# Patient Record
Sex: Female | Born: 1985
Health system: Southern US, Community
[De-identification: ages and names within clinical notes are randomized; demographics above are authoritative.]

## PROBLEM LIST (undated history)

## (undated) DIAGNOSIS — I959 Hypotension, unspecified: Secondary | ICD-10-CM

## (undated) DIAGNOSIS — M329 Systemic lupus erythematosus, unspecified: Secondary | ICD-10-CM

## (undated) DIAGNOSIS — IMO0002 Reserved for concepts with insufficient information to code with codable children: Secondary | ICD-10-CM

## (undated) DIAGNOSIS — I10 Essential (primary) hypertension: Secondary | ICD-10-CM

## (undated) HISTORY — DX: Essential (primary) hypertension: I10

## (undated) HISTORY — PX: NO PAST SURGERIES: SHX2092

---

## 2012-06-20 ENCOUNTER — Encounter (HOSPITAL_COMMUNITY): Payer: Self-pay | Admitting: Emergency Medicine

## 2012-06-20 ENCOUNTER — Emergency Department (HOSPITAL_COMMUNITY)
Admission: EM | Admit: 2012-06-20 | Discharge: 2012-06-20 | Disposition: A | Discharge status: Home / Self Care | Payer: Self-pay | Attending: Emergency Medicine | Admitting: Emergency Medicine

## 2012-06-20 DIAGNOSIS — R51 Headache: Secondary | ICD-10-CM | POA: Insufficient documentation

## 2012-06-20 DIAGNOSIS — Z8679 Personal history of other diseases of the circulatory system: Secondary | ICD-10-CM | POA: Insufficient documentation

## 2012-06-20 DIAGNOSIS — I959 Hypotension, unspecified: Secondary | ICD-10-CM | POA: Insufficient documentation

## 2012-06-20 HISTORY — DX: Hypotension, unspecified: I95.9

## 2012-06-20 MED ORDER — METOCLOPRAMIDE HCL 5 MG/ML IJ SOLN
10.0000 mg | Freq: Once | INTRAMUSCULAR | Status: AC
  Administered 2012-06-20: 10 mg via INTRAVENOUS
  Filled 2012-06-20: qty 2

## 2012-06-20 MED ORDER — SODIUM CHLORIDE 0.9 % IV BOLUS (SEPSIS)
1000.0000 mL | Freq: Once | INTRAVENOUS | Status: AC
  Administered 2012-06-20: 1000 mL via INTRAVENOUS

## 2012-06-20 MED ORDER — IBUPROFEN 800 MG PO TABS: 800.0000 mg | ORAL_TABLET | Freq: Three times a day (TID) | ORAL | Status: DC

## 2012-06-20 MED ORDER — DEXAMETHASONE SODIUM PHOSPHATE 4 MG/ML IJ SOLN
10.0000 mg | Freq: Once | INTRAMUSCULAR | Status: AC
  Administered 2012-06-20: 10 mg via INTRAVENOUS
  Filled 2012-06-20: qty 3

## 2012-06-20 MED ORDER — DIPHENHYDRAMINE HCL 50 MG/ML IJ SOLN
25.0000 mg | Freq: Once | INTRAMUSCULAR | Status: AC
  Administered 2012-06-20: 25 mg via INTRAVENOUS
  Filled 2012-06-20: qty 1

## 2012-06-20 NOTE — ED Notes (Signed)
The patient is AOx4 and comfortable with her discharge instructions. 

## 2012-06-20 NOTE — ED Notes (Signed)
MD at bedside. 

## 2012-06-20 NOTE — ED Provider Notes (Signed)
History     CSN: [id number]  Arrival date & time 06/20/12  0010   First MD Initiated Contact with Patient 06/20/12 [phone]      Chief Complaint  Patient presents with  . Headache    (Consider location/radiation/quality/duration/timing/severity/associated sxs/prior treatment) HPI  HX per PT thru Materials engineer.  HA onset yesterday, gradual and now severe. Location all over, mod to severe, no N/V, no neck pain or stiffness, no recorded fevers, no chills or rash, no sore throat or sick contacts. H/o similar HAs in the past.  She has been admitted in the past for low blood pressure and that is a PT concern tonight - BP has not been taken at home and here in the ER is WNL.   Past Medical History  Diagnosis Date  . Hypotension     History reviewed. No pertinent past surgical history.  History reviewed. No pertinent family history.  History  Substance Use Topics  . Smoking status: Never Smoker   . Smokeless tobacco: Not on file  . Alcohol Use: No    OB History   Grav Para Term Preterm Abortions TAB SAB Ect Mult Living                  Review of Systems  Constitutional: Negative for fever and chills.  HENT: Negative for neck pain and neck stiffness.   Eyes: Negative for pain.  Respiratory: Negative for shortness of breath.   Cardiovascular: Negative for chest pain.  Gastrointestinal: Negative for abdominal pain.  Genitourinary: Negative for dysuria.  Musculoskeletal: Negative for back pain.  Skin: Negative for rash.  Neurological: Positive for headaches. Negative for dizziness, tremors, syncope, speech difficulty, weakness and numbness.  All other systems reviewed and are negative.    Allergies  Review of patient's allergies indicates no known allergies.  Home Medications  No current outpatient prescriptions on file.  BP 100/63  Pulse 80  Temp(Src) 97.5 F (36.4 C) (Oral)  Resp 19  Ht 4\' 11"  (1.499 m)  Wt 79 lb 5.9 oz (36 kg)  BMI 16.02 kg/m2  SpO2 100%   LMP 06/17/2012  Physical Exam  Constitutional: She is oriented to person, place, and time. She appears well-developed and well-nourished.  HENT:  Head: Normocephalic and atraumatic.  Eyes: Conjunctivae and EOM are normal. Pupils are equal, round, and reactive to light.  Neck: Full passive range of motion without pain. Neck supple. No thyromegaly present.  No meningismus  Cardiovascular: Normal rate, regular rhythm, S1 normal, S2 normal and intact distal pulses.   Pulmonary/Chest: Effort normal and breath sounds normal.  Abdominal: Soft. Bowel sounds are normal. There is no tenderness. There is no CVA tenderness.  Musculoskeletal: Normal range of motion.  Neurological: She is alert and oriented to person, place, and time. She has normal strength and normal reflexes. No cranial nerve deficit or sensory deficit. She displays a negative Romberg sign. GCS eye subscore is 4. GCS verbal subscore is 5. GCS motor subscore is 6.  Normal Gait  Skin: Skin is warm and dry. No rash noted. No cyanosis. Nails show no clubbing.  Psychiatric: She has a normal mood and affect. Her speech is normal and behavior is normal.    ED Course  Procedures (including critical care time)  IVFs and IV HA cocktail  4:27 AM HA is much better, normal neuro exam  PLAN d/c home, Rx Motrin and referral to NEU.  No indication for imaging or admit at this time.   MDM  Headache with no neuro deficits. No meningismus.   Improved with IV fluids and medications provided.  Vital signs and nursing notes reviewed and considered        Sunnie Nielsen, MD 06/20/12 [phone]

## 2012-06-20 NOTE — ED Notes (Addendum)
Patient complaining of headache and body tingling that started around 0900 yesterday morning.  Patient complaining of dizziness; denies nausea and vomiting.  Interpreter phones used to translate -- patient speaks [place].

## 2012-06-26 ENCOUNTER — Emergency Department (HOSPITAL_COMMUNITY)
Admission: EM | Admit: 2012-06-26 | Discharge: 2012-06-27 | Disposition: A | Discharge status: Home / Self Care | Payer: Self-pay | Attending: Emergency Medicine | Admitting: Emergency Medicine

## 2012-06-26 ENCOUNTER — Encounter (HOSPITAL_COMMUNITY): Payer: Self-pay | Admitting: Emergency Medicine

## 2012-06-26 DIAGNOSIS — R0609 Other forms of dyspnea: Secondary | ICD-10-CM | POA: Insufficient documentation

## 2012-06-26 DIAGNOSIS — R0989 Other specified symptoms and signs involving the circulatory and respiratory systems: Secondary | ICD-10-CM | POA: Insufficient documentation

## 2012-06-26 DIAGNOSIS — J029 Acute pharyngitis, unspecified: Secondary | ICD-10-CM | POA: Insufficient documentation

## 2012-06-26 NOTE — ED Notes (Signed)
PT. REPORTS SORE THROAT WITH CHILLS ONSET THIS EVENING , DENIES COUGH OR FEVER .

## 2012-06-27 LAB — RAPID STREP SCREEN (MED CTR MEBANE ONLY): Streptococcus, Group A Screen (Direct): NEGATIVE

## 2012-06-27 MED ORDER — HYDROCODONE-ACETAMINOPHEN 5-325 MG PO TABS: 1.0000 | ORAL_TABLET | ORAL | Status: DC | PRN

## 2012-06-27 MED ORDER — LIDOCAINE VISCOUS 2 % MT SOLN
20.0000 mL | Freq: Once | OROMUCOSAL | Status: AC
  Administered 2012-06-27: 20 mL via OROMUCOSAL

## 2012-06-27 NOTE — ED Provider Notes (Signed)
Medical screening examination/treatment/procedure(s) were performed by non-physician practitioner and as supervising physician I was immediately available for consultation/collaboration.  Sunnie Nielsen, MD 06/27/12 [phone]

## 2012-06-27 NOTE — ED Notes (Signed)
Pt denies any pain or questions upon discharge. 

## 2012-06-27 NOTE — ED Provider Notes (Signed)
History     CSN: [id number]  Arrival date & time 06/26/12  2311   First MD Initiated Contact with Patient 06/27/12 0034      Chief Complaint  Patient presents with  . Sore Throat    (Consider location/radiation/quality/duration/timing/severity/associated sxs/prior treatment) HPI History provided by pt and interpreter.  Pt c/o sore throat since 9pm last night.  Associated w/ dyspnea.  Denies fever, nasal congestion, cough.  Denies lip/tongue edema and rash.  Has not taken anything for symptoms.  No known sick contacts.  No known allergies or new contacts.  Past Medical History  Diagnosis Date  . Hypotension     History reviewed. No pertinent past surgical history.  No family history on file.  History  Substance Use Topics  . Smoking status: Never Smoker   . Smokeless tobacco: Not on file  . Alcohol Use: No    OB History   Grav Para Term Preterm Abortions TAB SAB Ect Mult Living                  Review of Systems  All other systems reviewed and are negative.    Allergies  Review of patient's allergies indicates no known allergies.  Home Medications  No current outpatient prescriptions on file.  BP 137/91  Pulse 80  Temp(Src) 97.6 F (36.4 C) (Oral)  Resp 16  SpO2 100%  LMP 06/17/2012  Physical Exam  Nursing note and vitals reviewed. Constitutional: She is oriented to person, place, and time. She appears well-developed and well-nourished. No distress.  HENT:  Head: Normocephalic and atraumatic.  Posterior pharynx nml but soft palate slightly erythematous.  No tonsillar edema or exudate.  Uvula mid-line.  No trismus.  Epiglottis visible and appears nml  Eyes:  Normal appearance  Neck: Normal range of motion.  Cardiovascular: Normal rate and regular rhythm.   Pulmonary/Chest: Effort normal and breath sounds normal. No respiratory distress.  Musculoskeletal: Normal range of motion.  Lymphadenopathy:    She has cervical adenopathy.  Neurological: She is  alert and oriented to person, place, and time.  Skin: Skin is warm and dry. No rash noted.  Psychiatric: She has a normal mood and affect. Her behavior is normal.    ED Course  Procedures (including critical care time)  Labs Reviewed - No data to display No results found.   1. Viral pharyngitis       MDM  27yo F presents w/ sore throat x 4 hours.  Reports that the pain is making it difficult for her to breath.  On exam, afebrile, no respiratory distress, no stridor, mild erythema soft palate, no lip/tongue edema, no hives.  Rapid strep obtained d/t communication barrier.  Pt to receive viscous lidocaine.  Will reassess shortly.  Doubt anaphylaxis; I believe patient is misinterpreting her pain.  1:31 AM    Pt reports that her pain and difficulty breathing are resolved.  Strep screen neg.  D/c'd home w/ short course of vicodin and recommended ibuprofen as well.  Return precautions discussed.        Otilio Miu, PA-C 06/27/12 2000

## 2012-06-27 NOTE — ED Notes (Signed)
Pt denies any pain at this time.

## 2013-03-10 ENCOUNTER — Other Ambulatory Visit (HOSPITAL_COMMUNITY): Payer: Self-pay | Admitting: Nurse Practitioner

## 2013-03-10 DIAGNOSIS — Z3689 Encounter for other specified antenatal screening: Secondary | ICD-10-CM

## 2013-03-13 ENCOUNTER — Ambulatory Visit (HOSPITAL_COMMUNITY)
Admission: RE | Admit: 2013-03-13 | Discharge: 2013-03-13 | Disposition: A | Discharge status: Home / Self Care | Payer: Medicaid Other | Source: Ambulatory Visit | Attending: Nurse Practitioner | Admitting: Nurse Practitioner

## 2013-03-13 ENCOUNTER — Other Ambulatory Visit (HOSPITAL_COMMUNITY): Payer: Self-pay | Admitting: Nurse Practitioner

## 2013-03-13 ENCOUNTER — Encounter (HOSPITAL_COMMUNITY): Payer: Self-pay

## 2013-03-13 DIAGNOSIS — Z3689 Encounter for other specified antenatal screening: Secondary | ICD-10-CM

## 2014-02-23 ENCOUNTER — Encounter (HOSPITAL_COMMUNITY): Payer: Self-pay

## 2015-10-22 ENCOUNTER — Emergency Department (HOSPITAL_COMMUNITY)
Admission: EM | Admit: 2015-10-22 | Discharge: 2015-10-22 | Disposition: A | Discharge status: Home / Self Care | Payer: Self-pay | Attending: Emergency Medicine | Admitting: Emergency Medicine

## 2015-10-22 ENCOUNTER — Encounter (HOSPITAL_COMMUNITY): Payer: Self-pay | Admitting: *Deleted

## 2015-10-22 DIAGNOSIS — J029 Acute pharyngitis, unspecified: Secondary | ICD-10-CM | POA: Insufficient documentation

## 2015-10-22 LAB — RAPID STREP SCREEN (MED CTR MEBANE ONLY): Streptococcus, Group A Screen (Direct): NEGATIVE

## 2015-10-22 MED ORDER — ACETAMINOPHEN-CODEINE #3 300-30 MG PO TABS: 1.0000 | ORAL_TABLET | Freq: Four times a day (QID) | ORAL | Status: DC | PRN

## 2015-10-22 NOTE — Discharge Instructions (Signed)

## 2015-10-22 NOTE — ED Provider Notes (Signed)
CSN: [id number]     Arrival date & time 10/22/15  1644 History  By signing my name below, I, Ronney LionSuzanne Le, attest that this documentation has been prepared under the direction and in the presence of Newell RubbermaidJeffrey Tannia Contino, PA-C. Electronically Signed: Ronney LionSuzanne Le, ED Scribe. 10/22/2015. 7:01 PM.    Chief Complaint  Patient presents with  . Sore Throat   The history is provided by the patient and the spouse. A language interpreter was used (tele-interpreter (N[place]ali)).   HPI Comments: Cindy Stewart is a 30 y.o. female with a history of hypotension, who presents to the Emergency Department complaining of a constant, severe sore throat that began yesterday. She also complains of an associated subjective fever, sinus congestion, and anterior neck pain. Swallowing exacerbates her pain. No treatments were noted. Patient states she has had similar symptoms in the past. She denies a history of GERD. She denies nasal congestion, cough, or chest pain. Patient's husband states patient does not have an established PCP, as [place] recently [place] from [place].    Past Medical History  Diagnosis Date  . Hypotension    History reviewed. No pertinent past surgical history. No family history on file. Social History  Substance Use Topics  . Smoking status: Never Smoker   . Smokeless tobacco: None  . Alcohol Use: No   OB History    Gravida Para Term Preterm AB TAB SAB Ectopic Multiple Living   1              Review of Systems  Constitutional: Positive for fever (subjective).  HENT: Positive for sinus pressure and sore throat. Negative for congestion.   Respiratory: Negative for cough.   Cardiovascular: Negative for chest pain.  Musculoskeletal: Positive for neck pain.      Allergies  Review of patient's allergies indicates no known allergies.  Home Medications   Prior to Admission medications   Medication Sig Start Date End Date Taking? Authorizing Provider  acetaminophen-codeine (TYLENOL #3)  300-30 MG tablet Take 1-2 tablets by mouth every 6 (six) hours as needed for moderate pain. 10/22/15   Eyvonne MechanicJeffrey Heather Mckendree, PA-C  HYDROcodone-acetaminophen (NORCO/VICODIN) 5-325 MG per tablet Take 1 tablet by mouth every 4 (four) hours as needed for pain. 06/27/12   Catherine Schinlever, PA-C   BP 139/91 mmHg  Pulse 77  Temp(Src) 98.5 F (36.9 C) (Oral)  Resp 18  Ht 4\' 11"  (1.499 m)  Wt 38.465 kg  BMI 17.12 kg/m2  SpO2 99% Physical Exam  Constitutional: She is oriented to person, place, and time. She appears well-developed and well-nourished. No distress.  HENT:  Head: Normocephalic and atraumatic.  Right Ear: Hearing, tympanic membrane, external ear and ear canal normal.  Left Ear: Hearing, tympanic membrane, external ear and ear canal normal.  Nose: Mucosal edema present.  Mouth/Throat: Uvula is midline. Posterior oropharyngeal erythema present.  No tonsillar swelling or edema. No signs of PTA or RPA. Uvula is midline, rising with phonation. Posterior oropharynx slightly erythematous. No lesions noted. Ears are normal.   Eyes: Conjunctivae and EOM are normal.  Neck: Neck supple. No tracheal deviation present.  Tender cervical lymphadenopathy anteriorly  Cardiovascular: Normal rate.   Pulmonary/Chest: Effort normal. No respiratory distress.  Musculoskeletal: Normal range of motion.  Lymphadenopathy:    She has cervical adenopathy.  Neurological: She is alert and oriented to person, place, and time.  Skin: Skin is warm and dry.  Psychiatric: She has a normal mood and affect. Her behavior is normal.  Nursing note and  vitals reviewed.   ED Course  Procedures (including critical care time)  DIAGNOSTIC STUDIES: Oxygen Saturation is 99% on RA, normal by my interpretation.    COORDINATION OF CARE: 5:43 PM - Discussed treatment plan with pt at bedside which includes Rx antibiotics if rapid strep screen is positive. If negative, will treat as viral infection. Advised pt to follow up with a  PCP in 10 days if her symptoms do not improve. Will also have social work speak to pt to help establish PCP care. Pt verbalized understanding and agreed to plan.   6:58 PM - Discussed treatment plan with patient's husband at bedside, who speaks rudimentary [place]. Pt's husband instructed to have patient follow up at appointment made on Thursday, 10/28/15 at 9 AM. Advised Tylenol prn for pain. Will provide prescription for Tylenol, per husband's request. Pt's husband verbalized understanding and agreed to plan.    Labs Review Labs Reviewed  RAPID STREP SCREEN (NOT AT [facility])  CULTURE, GROUP A STREP [facility])   I have personally reviewed and evaluated these lab results as part of my medical decision-making.  MDM   Final diagnoses:  Pharyngitis   Labs: Rapid Strep Screen (negative)  Imaging:  Consults:  Therapeutics:  Discharge Meds:   Assessment/Plan:  Pt presents with likely viral pharyngitis. No difficulty swallowing, drooling, dysphonia,muffled voice, stridor, swelling of the neck, trismus, mouth pain, swelling/ pain in submandibular area or floor of mouth ,assymetry of tonsils, or ulcerations; unlikely epiglottitis, PTA, submandibular space infection, retropharyngeal space infection, or HIV. Pt treated here in the ED with therapeutics listed above, given strict return precautions, PCP follow-up for re-evaluation if symptoms persist beyond 5-7 days in duration, return to the ED if they worsen.  Case management consultation, giving [facility] and wellness.  I personally performed the services described in this documentation, which was scribed in my presence. The recorded information has been reviewed and is accurate.     Eyvonne MechanicJeffrey Madeeha Costantino, PA-C 10/22/15 2016  Derwood KaplanAnkit Nanavati, MD 10/23/15 [phone]

## 2015-10-22 NOTE — ED Notes (Signed)
Translator line used for discharge instructions. Pt and husband verbalized understanding of discharge instructions and follow up care.

## 2015-10-22 NOTE — ED Notes (Signed)
The pt is c/o a sore throat for 2 weeks lmp unknown  Language barrier

## 2015-10-25 LAB — CULTURE, GROUP A STREP (THRC)

## 2015-11-08 ENCOUNTER — Emergency Department (HOSPITAL_COMMUNITY)
Admission: EM | Admit: 2015-11-08 | Discharge: 2015-11-08 | Disposition: A | Discharge status: Home / Self Care | Payer: Self-pay | Attending: Dermatology | Admitting: Dermatology

## 2015-11-08 ENCOUNTER — Encounter (HOSPITAL_COMMUNITY): Payer: Self-pay

## 2015-11-08 DIAGNOSIS — Z5321 Procedure and treatment not carried out due to patient leaving prior to being seen by health care provider: Secondary | ICD-10-CM | POA: Insufficient documentation

## 2015-11-08 DIAGNOSIS — R079 Chest pain, unspecified: Secondary | ICD-10-CM | POA: Insufficient documentation

## 2015-11-08 LAB — CBC
HEMATOCRIT: 36.1 % (ref 36.0–46.0)
Hemoglobin: 11.6 g/dL — ABNORMAL LOW (ref 12.0–15.0)
MCH: 26 pg (ref 26.0–34.0)
MCHC: 32.1 g/dL (ref 30.0–36.0)
MCV: 80.8 fL (ref 78.0–100.0)
PLATELETS: 250 10*3/uL (ref 150–400)
RBC: 4.47 MIL/uL (ref 3.87–5.11)
RDW: 13 % (ref 11.5–15.5)
WBC: 7.7 10*3/uL (ref 4.0–10.5)

## 2015-11-08 LAB — I-STAT BETA HCG BLOOD, ED (MC, WL, AP ONLY)

## 2015-11-08 LAB — BASIC METABOLIC PANEL
ANION GAP: 4 — AB (ref 5–15)
BUN: 8 mg/dL (ref 6–20)
CHLORIDE: 112 mmol/L — AB (ref 101–111)
CO2: 24 mmol/L (ref 22–32)
Calcium: 9.3 mg/dL (ref 8.9–10.3)
Creatinine, Ser: 0.51 mg/dL (ref 0.44–1.00)
GFR calc non Af Amer: >60 mL/min (ref >60)
Glucose, Bld: 102 mg/dL — ABNORMAL HIGH (ref 65–99)
POTASSIUM: 3.9 mmol/L (ref 3.5–5.1)
SODIUM: 140 mmol/L (ref 135–145)

## 2015-11-08 LAB — I-STAT TROPONIN, ED: Troponin i, poc: 0 ng/mL (ref 0.00–0.08)

## 2015-11-08 NOTE — ED Notes (Signed)
Pts name called for vital sign reassessment - no answer.  

## 2015-11-08 NOTE — ED Notes (Signed)
Translator phone used for triage - pt here with c/o burning chest pain and abdominal pain that started about 2-3 months ago. She came to R today because the pain became worse and developed SOB. Pt speaking clear complete sentences at triage.

## 2016-02-08 ENCOUNTER — Emergency Department (HOSPITAL_COMMUNITY)
Admission: EM | Admit: 2016-02-08 | Discharge: 2016-02-09 | Disposition: A | Discharge status: Home / Self Care | Payer: Medicaid Other | Attending: Dermatology | Admitting: Dermatology

## 2016-02-08 ENCOUNTER — Encounter (HOSPITAL_COMMUNITY): Payer: Self-pay | Admitting: Emergency Medicine

## 2016-02-08 DIAGNOSIS — Z5321 Procedure and treatment not carried out due to patient leaving prior to being seen by health care provider: Secondary | ICD-10-CM | POA: Insufficient documentation

## 2016-02-08 DIAGNOSIS — L509 Urticaria, unspecified: Secondary | ICD-10-CM | POA: Insufficient documentation

## 2016-02-08 NOTE — ED Triage Notes (Signed)
Pt. reports generalized itchy skin rashes/hives onset last night , denies SOB , airway intact /no oral swelling , denies fever or chills.

## 2016-03-25 ENCOUNTER — Encounter (HOSPITAL_COMMUNITY): Payer: Self-pay | Admitting: *Deleted

## 2016-03-25 DIAGNOSIS — R0602 Shortness of breath: Secondary | ICD-10-CM | POA: Diagnosis present

## 2016-03-25 DIAGNOSIS — J029 Acute pharyngitis, unspecified: Secondary | ICD-10-CM | POA: Diagnosis not present

## 2016-03-25 NOTE — ED Triage Notes (Signed)
The pt  Is c.o a cold and a cough since yesterday with the inside of her nose burning lt ear pain  lmp nov

## 2016-03-26 ENCOUNTER — Emergency Department (HOSPITAL_COMMUNITY)
Admission: EM | Admit: 2016-03-26 | Discharge: 2016-03-26 | Disposition: A | Discharge status: Home / Self Care | Payer: Medicaid Other | Attending: Emergency Medicine | Admitting: Emergency Medicine

## 2016-03-26 ENCOUNTER — Emergency Department (HOSPITAL_COMMUNITY): Payer: Medicaid Other

## 2016-03-26 DIAGNOSIS — J029 Acute pharyngitis, unspecified: Secondary | ICD-10-CM

## 2016-03-26 HISTORY — DX: Systemic lupus erythematosus, unspecified: M32.9

## 2016-03-26 HISTORY — DX: Reserved for concepts with insufficient information to code with codable children: IMO0002

## 2016-03-26 LAB — CBC WITH DIFFERENTIAL/PLATELET
Basophils Absolute: 0 10*3/uL (ref 0.0–0.1)
Basophils Relative: 0 %
Eosinophils Absolute: 0.4 10*3/uL (ref 0.0–0.7)
Eosinophils Relative: 7 %
HCT: 34.8 % — ABNORMAL LOW (ref 36.0–46.0)
Hemoglobin: 11.9 g/dL — ABNORMAL LOW (ref 12.0–15.0)
Lymphocytes Relative: 36 %
Lymphs Abs: 1.8 10*3/uL (ref 0.7–4.0)
MCH: 28.3 pg (ref 26.0–34.0)
MCHC: 34.2 g/dL (ref 30.0–36.0)
MCV: 82.9 fL (ref 78.0–100.0)
Monocytes Absolute: 0.5 10*3/uL (ref 0.1–1.0)
Monocytes Relative: 10 %
Neutro Abs: 2.4 10*3/uL (ref 1.7–7.7)
Neutrophils Relative %: 47 %
Platelets: 178 10*3/uL (ref 150–400)
RBC: 4.2 MIL/uL (ref 3.87–5.11)
RDW: 12.1 % (ref 11.5–15.5)
WBC: 5.1 10*3/uL (ref 4.0–10.5)

## 2016-03-26 LAB — BASIC METABOLIC PANEL
Anion gap: 7 (ref 5–15)
BUN: 11 mg/dL (ref 6–20)
CO2: 21 mmol/L — ABNORMAL LOW (ref 22–32)
Calcium: 8.8 mg/dL — ABNORMAL LOW (ref 8.9–10.3)
Chloride: 108 mmol/L (ref 101–111)
Creatinine, Ser: 0.43 mg/dL — ABNORMAL LOW (ref 0.44–1.00)
GFR calc Af Amer: >60 mL/min (ref >60)
GFR calc non Af Amer: >60 mL/min (ref >60)
Glucose, Bld: 95 mg/dL (ref 65–99)
Potassium: 3.6 mmol/L (ref 3.5–5.1)
Sodium: 136 mmol/L (ref 135–145)

## 2016-03-26 LAB — I-STAT BETA HCG BLOOD, ED (MC, WL, AP ONLY): I-stat hCG, quantitative: <5 m[IU]/mL (ref <5)

## 2016-03-26 LAB — I-STAT TROPONIN, ED: Troponin i, poc: 0 ng/mL (ref 0.00–0.08)

## 2016-03-26 LAB — D-DIMER, QUANTITATIVE (NOT AT ARMC): D-Dimer, Quant: <0.27 ug/mL-FEU (ref 0.00–0.50)

## 2016-03-26 LAB — RAPID STREP SCREEN (MED CTR MEBANE ONLY): Streptococcus, Group A Screen (Direct): NEGATIVE

## 2016-03-26 MED ORDER — IBUPROFEN 400 MG PO TABS
600.0000 mg | ORAL_TABLET | Freq: Once | ORAL | Status: AC
  Administered 2016-03-26: 600 mg via ORAL
  Filled 2016-03-26: qty 1

## 2016-03-26 MED ORDER — ACETAMINOPHEN 325 MG PO TABS
650.0000 mg | ORAL_TABLET | Freq: Once | ORAL | Status: AC
  Administered 2016-03-26: 650 mg via ORAL
  Filled 2016-03-26: qty 2

## 2016-03-26 NOTE — ED Provider Notes (Signed)
[facility]-EMERGENCY DEPT Provider Note   CSN: [id number] Arrival date & time: 03/25/16  2304     History   Chief Complaint Chief Complaint  Patient presents with  . URI    HPI Cindy Stewart is a 30 y.o. female with history of lupus who presents with a one-day history of sore throat. Patient also reports shortness of breath with pleuritic chest pain that has been going on over the past few months. Patient sore throat is worse on swallowing. Patient states that she feels that when she takes a deep breath something is stopping her from breathing. Patient denies any recent long trips, surgeries, cancer, new leg pain or swelling, exogenous estrogen use. Patient also denies any fevers, abdominal pain, nausea, vomiting, urinary symptoms. Patient has not taken any medications for her pain. Patient states she has been trying to see the family medicine office for her shortness of breath and chest pain for a few months and she has not been able to be seen.  I used a [place] a Nurse, learning disabilitytranslator for my H&P, however my H&P was limited due to language barrier and insufficient technology.  HPI  Past Medical History:  Diagnosis Date  . Hypotension   . Lupus     Patient Active Problem List   Diagnosis Date Noted  . Hypotension     History reviewed. No pertinent surgical history.  OB History    Gravida Para Term Preterm AB Living   1             SAB TAB Ectopic Multiple Live Births                   Home Medications    Prior to Admission medications   Medication Sig Start Date End Date Taking? Authorizing Provider  acetaminophen-codeine (TYLENOL #3) 300-30 MG tablet Take 1-2 tablets by mouth every 6 (six) hours as needed for moderate pain. 10/22/15   Eyvonne MechanicJeffrey Hedges, PA-C  HYDROcodone-acetaminophen (NORCO/VICODIN) 5-325 MG per tablet Take 1 tablet by mouth every 4 (four) hours as needed for pain. 06/27/12   Ruby Colaatherine Schinlever, PA-C    Family History No family history on file.  Social  History Social History  Substance Use Topics  . Smoking status: Never Smoker  . Smokeless tobacco: Never Used  . Alcohol use No     Allergies   Patient has no known allergies.   Review of Systems Review of Systems  Constitutional: Negative for chills and fever.  HENT: Positive for sore throat. Negative for congestion and facial swelling.   Respiratory: Positive for shortness of breath. Negative for cough.   Cardiovascular: Positive for chest pain (pleuritic). Negative for leg swelling.  Gastrointestinal: Negative for abdominal pain, nausea and vomiting.  Genitourinary: Negative for dysuria.  Musculoskeletal: Negative for back pain.  Skin: Negative for rash and wound.  Neurological: Negative for headaches.  Psychiatric/Behavioral: The patient is not nervous/anxious.      Physical Exam Updated Vital Signs BP 114/84 (BP Location: Right Arm)   Pulse 73   Temp 98.3 F (36.8 C) (Oral)   Resp 16   LMP 03/14/2016   SpO2 100%   Physical Exam  Constitutional: She appears well-developed and well-nourished. No distress.  HENT:  Head: Normocephalic and atraumatic.  Mouth/Throat: Mucous membranes are normal. No trismus in the jaw. Posterior oropharyngeal erythema present. No oropharyngeal exudate, posterior oropharyngeal edema or tonsillar abscesses.  Eyes: Conjunctivae are normal. Pupils are equal, round, and reactive to light. Right eye exhibits no discharge. Left  eye exhibits no discharge. No scleral icterus.  Neck: Normal range of motion. Neck supple. No thyromegaly present.  Cardiovascular: Normal rate, regular rhythm, normal heart sounds and intact distal pulses.  Exam reveals no gallop and no friction rub.   No murmur heard. Pulmonary/Chest: Effort normal and breath sounds normal. No stridor. No respiratory distress. She has no wheezes. She has no rales. She exhibits no tenderness.  Abdominal: Soft. Bowel sounds are normal. She exhibits no distension. There is no tenderness.  There is no rebound and no guarding.  Musculoskeletal: She exhibits no edema.  Lymphadenopathy:    She has no cervical adenopathy.  Neurological: She is alert. Coordination normal.  Skin: Skin is warm and dry. No rash noted. She is not diaphoretic. No pallor.  Psychiatric: She has a normal mood and affect.  Nursing note and vitals reviewed.    ED Treatments / Results  Labs (all labs ordered are listed, but only abnormal results are displayed) Labs Reviewed  RAPID STREP SCREEN (NOT AT [facility])  BASIC METABOLIC PANEL  CBC WITH DIFFERENTIAL/PLATELET  D-DIMER, QUANTITATIVE (NOT AT [facility])  I-STAT BETA HCG BLOOD, ED ([facility], [facility], [facility] ONLY)  I-STAT TROPOININ, ED    EKG  EKG Interpretation None       Radiology No results found.  Procedures Procedures (including critical care time)  Medications Ordered in ED Medications  ibuprofen (ADVIL,MOTRIN) tablet 600 mg (not administered)     Initial Impression / Assessment and Plan / ED Course  I have reviewed the triage vital signs and the nursing notes.  Pertinent labs & imaging results that were available during my care of the patient were reviewed by me and considered in my medical decision making (see chart for details).  Clinical Course     Most likely upper respiratory symptoms, however concern for pleuritic chest pain and further working the patient up with chest x-ray, troponin, d-dimer, CBC, BMP, rapid strep. These are all pending. At shift change, patient care transferred to Elpidio AnisShari Upstill, PA-C for continued evaluation, follow up of CXR, labs, rapid strep and determination of disposition.    Final Clinical Impressions(s) / ED Diagnoses   Final diagnoses:  None    New Prescriptions New Prescriptions   No medications on file     Emi Holeslexandra M Adelae Yodice, PA-C 03/26/16 1753    Shon Batonourtney F Horton, MD 03/27/16 [phone]

## 2016-03-26 NOTE — Discharge Instructions (Signed)
Take Tylenol and/or ibuprofen for comfort. Push fluids. Return here as needed.

## 2016-03-26 NOTE — ED Provider Notes (Signed)
Sxs of ST yesterday, hurts to swallow sxs x months - SOB, pleuritic CP; no fever, no PE risk factors, no leg swelling  Plan: CBC, BMP, d-dime, CXR, strep pending Will need re-evaluation and review of labs [facility]  Via interpreter the patient and her husband were given the results of her evaluation - all tests appear stable and without significant abnormality to explain the patient's symptoms. Will refer to Dr. Docia ChuckKoirala but did explain that I cannot tell whether his office will be able to accept her a new patient, that they would have to call the office to ask. She reports she is feeling better. VSS.    Elpidio AnisShari Edlyn Rosenburg, PA-C 03/26/16 [phone]    Shon Batonourtney F Horton, MD 03/26/16 [phone]

## 2016-03-28 LAB — CULTURE, GROUP A STREP (THRC)

## 2016-04-04 ENCOUNTER — Inpatient Hospital Stay: Payer: Medicaid Other

## 2016-04-07 ENCOUNTER — Ambulatory Visit: Payer: Medicaid Other | Attending: Internal Medicine | Admitting: Physician Assistant

## 2016-04-07 ENCOUNTER — Encounter: Payer: Self-pay | Admitting: Physician Assistant

## 2016-04-07 VITALS — BP 135/90 | HR 75 | Temp 98.2°F | Resp 16 | Wt 85.0 lb

## 2016-04-07 DIAGNOSIS — J312 Chronic pharyngitis: Secondary | ICD-10-CM | POA: Insufficient documentation

## 2016-04-07 DIAGNOSIS — J029 Acute pharyngitis, unspecified: Secondary | ICD-10-CM

## 2016-04-07 DIAGNOSIS — M329 Systemic lupus erythematosus, unspecified: Secondary | ICD-10-CM | POA: Diagnosis not present

## 2016-04-07 DIAGNOSIS — M25562 Pain in left knee: Secondary | ICD-10-CM

## 2016-04-07 DIAGNOSIS — N926 Irregular menstruation, unspecified: Secondary | ICD-10-CM

## 2016-04-07 DIAGNOSIS — R0981 Nasal congestion: Secondary | ICD-10-CM | POA: Insufficient documentation

## 2016-04-07 LAB — TSH: TSH: 2.37 mIU/L

## 2016-04-07 LAB — POCT URINE PREGNANCY: PREG TEST UR: NEGATIVE

## 2016-04-07 MED ORDER — FLUTICASONE PROPIONATE 50 MCG/ACT NA SUSP: 2.0000 | Freq: Every day | NASAL | 6 refills | Status: DC

## 2016-04-07 MED ORDER — NAPROXEN 500 MG PO TABS
500.0000 mg | ORAL_TABLET | Freq: Two times a day (BID) | ORAL | 0 refills | Status: DC
Start: 2016-04-07 — End: 2016-10-19

## 2016-04-07 MED FILL — NAPROXEN 500 MG TABLET: 500 | 30 days supply | Qty: 60 | Fill #0

## 2016-04-07 MED FILL — FLUTICASONE PROP 50 MCG SPR: 50 | 30 days supply | Qty: 16 | Fill #0

## 2016-04-07 NOTE — Progress Notes (Signed)
Patient ID: Cindy Stewart, female   DOB: 08/28/1985, 30 y.o.   MRN: [id number]     Cindy Stewart, is a 30 y.o. female  [id number]  [id number]  DOB - 01/07/1986  Subjective:  Chief Complaint and HPI: Cindy Stewart is a 30 y.o. female here today to establish care and for a follow up visit after being seen in the ED for 1 day h/o ST and SOB/pleuritic CP for months.  Cardiac enzymes and D-Dimer were normal.  EKG did not show any acute ischemic changes.  Strep test was negative.  Hcg, BMP, and CBC were essentially normal.  Today, she c/o ST and pain swallowing that she describes as "chronic."  She says this has been going on since she was in [place](~6 years). She moved here about 5-6 years ago.  She has this problem on and off. Nothing has helped.  She also c/o sinus congestion and intermittent pain in her ears.  Occasional headaches.    Also, irregular periods for about 3 years.  She had a baby in 2014. She has norplant.  [place]ali interpreter through [employer]" used.    Also, c/o intermittent L knee pain for years.  [employer]. Marland Kitchen.   ED/Hospital notes/labs reviewed.    ROS:   Constitutional:  No f/c, No night sweats, No unexplained weight loss. EENT:  No vision changes, No blurry vision, No hearing changes. + ear congestion/intermittent pain, on and off ST Respiratory: No cough, No SOB Cardiac: No CP, no palpitations GI:  No abd pain, No N/V/D. GU: No Urinary s/sx. +irregular periods Musculoskeletal: L knee pain Neuro: No headache, no dizziness, no motor weakness.  Skin: No rash Endocrine:  No polydipsia. No polyuria.  Psych: Denies SI/HI  No problems updated.  ALLERGIES: No Known Allergies  PAST MEDICAL HISTORY: Past Medical History:  Diagnosis Date  . Hypotension   . Lupus     MEDICATIONS AT HOME: Prior to Admission medications   Medication Sig Start Date End Date Taking? Authorizing Provider  fluticasone (FLONASE) 50 MCG/ACT nasal spray Place 2 sprays into both  nostrils daily. 04/07/16   Anders SimmondsAngela M Janalee Grobe, PA-C  naproxen (NAPROSYN) 500 MG tablet Take 1 tablet (500 mg total) by mouth 2 (two) times daily with a meal. Prn pain 04/07/16   Anders SimmondsAngela M Thayer Embleton, PA-C     Objective:  EXAM:   Vitals:   04/07/16 1419  BP: 135/90  Pulse: 75  Resp: 16  Temp: 98.2 F (36.8 C)  TempSrc: Oral  SpO2: 100%  Weight: 85 lb (38.6 kg)    General appearance : A&OX3. NAD. Non-toxic-appearing HEENT: Atraumatic and Normocephalic.  PERRLA. EOM intact.  TM clear B. Mouth-MMM, post pharynx WNL w/o erythema, No PND. Neck: supple, no JVD. No cervical lymphadenopathy. No thyromegaly Chest/Lungs:  Breathing-non-labored, Good air entry bilaterally, breath sounds normal without rales, rhonchi, or wheezing  CVS: S1 S2 regular, no murmurs, gallops, rubs  Extremities: Bilateral Lower Ext shows no edema, both legs are warm to touch with = pulse throughout.  l knee-skin is warm, dry and intact.  No ballotment.  Joint is stable without ligamentous or meniscal signs.   Neurology:  CN II-XII grossly intact, Non focal.   Psych:  TP linear. J/I WNL. Normal speech. Appropriate eye contact and affect.  Skin:  No Rash  Data Review No results found for: HGBA1C   Assessment & Plan   1. 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 Prn pain  Dispense: 60 tablet; Refill: 0  2. Irregular periods Likely due to norplant - TSH - Vitamin D, 25-hydroxy - POCT urine pregnancy  3. Chronic sore throat/sinus congestion-flonase, cold air humidifier, and salt water gargles.  This has been a problem for many years  Patient have been counseled extensively about nutrition and exercise  Return in about 1 month (around 05/08/2016) for estbalish care and f/up on chronic sore throat/knee pain/irregular periods.  The patient was given clear  instructions to go to ER or return to medical center if symptoms don't improve, worsen or new problems develop. The patient verbalized understanding. The patient was told to call to get lab results if they haven't heard anything in the next week.     Georgian CoAngela Ugo Thoma, PA-C [facility] [place], [place] [phone]   04/07/2016, 2:39 PM After being seen in the ED for 1 day h/o ST and SOB/pleuritic CP for months.  Cardiac enzymes and D-Dimer were normal.  EKG did not show any acute ischemic changes.  Strep test was negative.  Hcg, BMP, and CBC were essentially normal.  Today, she c/o ST and pain swallowing that she describes as "chronic."  She says this has been going on since she was in [place]. She moved here about 5-6 years ago.  She has this problem on and off. Nothing has helped.  She also c/o sinus congestion and intermittent pain in her ears.  Occasional headaches.    Also, irregular periods for about 3 years.  She had a baby in 2014. She has norplant.  N[place]ali interpreter through [employer]" used.    Also, c/o intermittent L knee pain for years.  NKI.

## 2016-04-07 NOTE — Patient Instructions (Signed)
Warm salt water gargles twice daily.  Cold Air humidifier.

## 2016-04-08 LAB — VITAMIN D 25 HYDROXY (VIT D DEFICIENCY, FRACTURES): Vit D, 25-Hydroxy: 16 ng/mL — ABNORMAL LOW (ref 30–100)

## 2016-04-09 ENCOUNTER — Other Ambulatory Visit: Payer: Self-pay | Admitting: Physician Assistant

## 2016-04-09 DIAGNOSIS — E559 Vitamin D deficiency, unspecified: Secondary | ICD-10-CM

## 2016-04-09 MED ORDER — VITAMIN D (ERGOCALCIFEROL) 1.25 MG (50000 UNIT) PO CAPS: 50000.0000 [IU] | ORAL_CAPSULE | ORAL | 0 refills | Status: DC

## 2016-05-25 ENCOUNTER — Emergency Department (HOSPITAL_COMMUNITY): Payer: Medicaid Other

## 2016-05-25 ENCOUNTER — Emergency Department (HOSPITAL_COMMUNITY)
Admission: EM | Admit: 2016-05-25 | Discharge: 2016-05-25 | Disposition: A | Discharge status: Home / Self Care | Payer: Medicaid Other | Attending: Emergency Medicine | Admitting: Emergency Medicine

## 2016-05-25 ENCOUNTER — Encounter (HOSPITAL_COMMUNITY): Payer: Self-pay

## 2016-05-25 ENCOUNTER — Ambulatory Visit: Payer: Medicaid Other | Admitting: Physician Assistant

## 2016-05-25 ENCOUNTER — Telehealth: Payer: Self-pay | Admitting: *Deleted

## 2016-05-25 DIAGNOSIS — R51 Headache: Secondary | ICD-10-CM | POA: Diagnosis not present

## 2016-05-25 DIAGNOSIS — R519 Headache, unspecified: Secondary | ICD-10-CM

## 2016-05-25 LAB — POC URINE PREG, ED: Preg Test, Ur: NEGATIVE

## 2016-05-25 MED ORDER — METOCLOPRAMIDE HCL 10 MG PO TABS
5.0000 mg | ORAL_TABLET | Freq: Once | ORAL | Status: AC
  Administered 2016-05-25: 5 mg via ORAL
  Filled 2016-05-25: qty 1

## 2016-05-25 MED ORDER — DIPHENHYDRAMINE HCL 25 MG PO CAPS
25.0000 mg | ORAL_CAPSULE | Freq: Once | ORAL | Status: AC
  Administered 2016-05-25: 25 mg via ORAL
  Filled 2016-05-25: qty 1

## 2016-05-25 MED ORDER — KETOROLAC TROMETHAMINE 15 MG/ML IJ SOLN
15.0000 mg | Freq: Once | INTRAMUSCULAR | Status: AC
  Administered 2016-05-25: 15 mg via INTRAMUSCULAR
  Filled 2016-05-25: qty 1

## 2016-05-25 NOTE — ED Triage Notes (Signed)
Patient complains of generalized headache x 10 days, no vomiting, denies trauma. Alert and oriented. Told to come for CT scan

## 2016-05-25 NOTE — Telephone Encounter (Signed)
Call patient to triage symptom. No answer, unable to reach. Pt voicemail box is full.

## 2016-05-25 NOTE — ED Notes (Signed)
Patient transported to CT 

## 2016-05-25 NOTE — Discharge Instructions (Signed)
Please read attached information. If you experience any new or worsening signs or symptoms please return to the emergency room for evaluation. Please follow-up with your primary care provider or specialist as discussed.  Please use Tylenol as needed for headache. °

## 2016-05-25 NOTE — ED Provider Notes (Signed)
[facility]-EMERGENCY DEPT Provider Note   CSN: [id number] Arrival date & time: 05/25/16  1257  History   Chief Complaint Chief Complaint  Patient presents with  . Headache    HPI Cindy Stewart is a 31 y.o. female.  HPI   31 year old female presents today with complaints of headache. Patient has numerous chronic complaints as review systems was not successful as she has pain reported everywhere. Patient speaks Publishing copyepali translator was used. She notes headache described as posterior sharp in nature starting mild this morning around 6 AM and worsening throughout the day. Patient was at work and sent for further evaluation.   Patient reports associated blurred vision worse on the left. No focal neurological deficits.   Past Medical History:  Diagnosis Date  . Hypotension   . Lupus     Patient Active Problem List   Diagnosis Date Noted  . Hypotension     History reviewed. No pertinent surgical history.  OB History    Gravida Para Term Preterm AB Living   1             SAB TAB Ectopic Multiple Live Births                   Home Medications    Prior to Admission medications   Medication Sig Start Date End Date Taking? Authorizing Provider  fluticasone (FLONASE) 50 MCG/ACT nasal spray Place 2 sprays into both nostrils daily. Patient not taking: Reported on 05/25/2016 04/07/16   Anders SimmondsAngela M McClung, PA-C  naproxen (NAPROSYN) 500 MG tablet Take 1 tablet (500 mg total) by mouth 2 (two) times daily with a meal. Prn pain Patient not taking: Reported on 05/25/2016 04/07/16   Anders SimmondsAngela M McClung, PA-C  Vitamin D, Ergocalciferol, (DRISDOL) 50000 units CAPS capsule Take 1 capsule (50,000 Units total) by mouth every 7 (seven) days. Patient not taking: Reported on 05/25/2016 04/09/16   Anders SimmondsAngela M McClung, PA-C    Family History No family history on file.  Social History Social History  Substance Use Topics  . Smoking status: Never Smoker  . Smokeless tobacco: Never Used  . Alcohol use No      Allergies   Patient has no known allergies.   Review of Systems Review of Systems  All other systems reviewed and are negative.  Physical Exam Updated Vital Signs BP 123/81 (BP Location: Right Arm)   Pulse 82   Temp 98.4 F (36.9 C)   Resp 16   SpO2 100%   Physical Exam  Constitutional: She is oriented to person, place, and time. She appears well-developed and well-nourished.  HENT:  Head: Normocephalic and atraumatic.  Eyes: Conjunctivae are normal. Pupils are equal, round, and reactive to light. Right eye exhibits no discharge. Left eye exhibits no discharge. No scleral icterus.  Neck: Normal range of motion. No JVD present. No tracheal deviation present.  Pulmonary/Chest: Effort normal. No stridor.  Neurological: She is alert and oriented to person, place, and time. She has normal strength. No cranial nerve deficit or sensory deficit. Coordination normal. GCS eye subscore is 4. GCS verbal subscore is 5. GCS motor subscore is 6.  Psychiatric: She has a normal mood and affect. Her behavior is normal. Judgment and thought content normal.  Nursing note and vitals reviewed.    ED Treatments / Results  Labs (all labs ordered are listed, but only abnormal results are displayed) Labs Reviewed  POC URINE PREG, ED    EKG  EKG Interpretation None  Radiology Ct Head Wo Contrast  Result Date: 05/25/2016 CLINICAL DATA:  Acute onset of generalized headache. Initial encounter. EXAM: CT HEAD WITHOUT CONTRAST TECHNIQUE: Contiguous axial images were obtained from the base of the skull through the vertex without intravenous contrast. COMPARISON:  None. FINDINGS: Brain: No evidence of acute infarction, hemorrhage, hydrocephalus, extra-axial collection or mass lesion/mass effect. The posterior fossa, including the cerebellum, brainstem and fourth ventricle, is within normal limits. The third and lateral ventricles, and basal ganglia are unremarkable in appearance. The cerebral  hemispheres are symmetric in appearance, with normal gray-white differentiation. No mass effect or midline shift is seen. Vascular: No hyperdense vessel or unexpected calcification. Skull: There is no evidence of fracture; visualized osseous structures are unremarkable in appearance. Sinuses/Orbits: The orbits are within normal limits. There is partial opacification of the ethmoid air cells bilaterally. The paranasal sinuses and mastoid air cells are well-aerated. Other: No significant soft tissue abnormalities are seen. IMPRESSION: No acute intracranial pathology seen on CT. Electronically Signed   By: Roanna Raider M.D.   On: 05/25/2016 18:12    Procedures Procedures (including critical care time)  Medications Ordered in ED Medications  ketorolac (TORADOL) 15 MG/ML injection 15 mg (15 mg Intramuscular Given 05/25/16 1742)  metoCLOPramide (REGLAN) tablet 5 mg (5 mg Oral Given 05/25/16 1742)  diphenhydrAMINE (BENADRYL) capsule 25 mg (25 mg Oral Given 05/25/16 1742)     Initial Impression / Assessment and Plan / ED Course  I have reviewed the triage vital signs and the nursing notes.  Pertinent labs & imaging results that were available during my care of the patient were reviewed by me and considered in my medical decision making (see chart for details).      Final Clinical Impressions(s) / ED Diagnoses   Final diagnoses:  Nonintractable headache, unspecified chronicity pattern, unspecified headache type    Labs:  Imaging: CT head without  Consults:  Therapeutics: Toradol, Reglan, Benadryl  Discharge Meds:   Assessment/Plan:  31 year old female presents today with headache. This is a very difficult exam as patient has numerous chronic complaints of pain. Patient is a very well-appearing with normal neurological exam. She will be treated for her headache with CT scan. I have very low suspicion for acute intracranial abnormality in this patient. No signs of infection, no other red  flags.   Patient's pain improved, vision improved, no neurological deficits. Discharged home with symptomatic care instructions and strict return precautions.    New Prescriptions Discharge Medication List as of 05/25/2016  7:42 PM       Eyvonne Mechanic, PA-C 05/25/16 1610    Gwyneth Sprout, MD 05/26/16 2320

## 2016-05-25 NOTE — ED Notes (Signed)
Pt stated headache has been going on for about a week. No nausea or vomitting and no diarrhea

## 2016-05-25 NOTE — Telephone Encounter (Signed)
Interpreter used for translation [phone]- [phone]. Headache present, pt states it's pretty bad, rates pain 6/10. She states she has weakness in hands and knees, difficulty speaking, due to SOB, denies cold sx's of cough.  Sx's present for 10 days now has gotten worse.

## 2016-06-06 ENCOUNTER — Ambulatory Visit: Payer: Medicaid Other | Attending: Internal Medicine | Admitting: Internal Medicine

## 2016-06-06 ENCOUNTER — Encounter: Payer: Self-pay | Admitting: Internal Medicine

## 2016-06-06 VITALS — BP 136/89 | HR 74 | Temp 98.4°F | Resp 16 | Wt 88.2 lb

## 2016-06-06 DIAGNOSIS — R51 Headache: Secondary | ICD-10-CM | POA: Diagnosis not present

## 2016-06-06 DIAGNOSIS — D649 Anemia, unspecified: Secondary | ICD-10-CM

## 2016-06-06 DIAGNOSIS — Z114 Encounter for screening for human immunodeficiency virus [HIV]: Secondary | ICD-10-CM

## 2016-06-06 DIAGNOSIS — K219 Gastro-esophageal reflux disease without esophagitis: Secondary | ICD-10-CM | POA: Diagnosis not present

## 2016-06-06 DIAGNOSIS — Z131 Encounter for screening for diabetes mellitus: Secondary | ICD-10-CM | POA: Insufficient documentation

## 2016-06-06 DIAGNOSIS — R42 Dizziness and giddiness: Secondary | ICD-10-CM

## 2016-06-06 DIAGNOSIS — E440 Moderate protein-calorie malnutrition: Secondary | ICD-10-CM

## 2016-06-06 DIAGNOSIS — Z23 Encounter for immunization: Secondary | ICD-10-CM

## 2016-06-06 DIAGNOSIS — M329 Systemic lupus erythematosus, unspecified: Secondary | ICD-10-CM | POA: Diagnosis not present

## 2016-06-06 DIAGNOSIS — E559 Vitamin D deficiency, unspecified: Secondary | ICD-10-CM

## 2016-06-06 DIAGNOSIS — Z789 Other specified health status: Secondary | ICD-10-CM

## 2016-06-06 DIAGNOSIS — R519 Headache, unspecified: Secondary | ICD-10-CM

## 2016-06-06 DIAGNOSIS — H538 Other visual disturbances: Secondary | ICD-10-CM

## 2016-06-06 LAB — POCT GLYCOSYLATED HEMOGLOBIN (HGB A1C): HEMOGLOBIN A1C: 5.2

## 2016-06-06 MED ORDER — FAMOTIDINE 20 MG PO TABS: 20.0000 mg | ORAL_TABLET | Freq: Two times a day (BID) | ORAL | 1 refills | Status: DC

## 2016-06-06 MED ORDER — VITAMIN D (ERGOCALCIFEROL) 1.25 MG (50000 UNIT) PO CAPS: 50000.0000 [IU] | ORAL_CAPSULE | ORAL | 0 refills | Status: DC

## 2016-06-06 MED ORDER — PANTOPRAZOLE SODIUM 40 MG PO TBEC: 40.0000 mg | DELAYED_RELEASE_TABLET | Freq: Every day | ORAL | 2 refills | Status: DC

## 2016-06-06 MED FILL — VIT D2 1.25 MG (50,000 UNIT: 1.25 MG | 28 days supply | Qty: 4 | Fill #0

## 2016-06-06 MED FILL — ?PANTOPRAZOLE SOD DR 40MG: 40 MG | 30 days supply | Qty: 30 | Fill #0

## 2016-06-06 MED FILL — FAMOTIDINE 20 MG TABLET: 20 | 30 days supply | Qty: 60 | Fill #0

## 2016-06-06 NOTE — Patient Instructions (Addendum)
Once done with vit d prescription, take over the counter vitamin D 5,000 IU/daily    Influenza Virus Vaccine injection (Fluarix) What is this medicine? INFLUENZA VIRUS VACCINE (in floo EN zuh VAHY ruhs vak SEEN) helps to reduce the risk of getting influenza also known as the flu. This medicine may be used for other purposes; ask your health care provider or pharmacist if you have questions. COMMON BRAND NAME(S): Fluarix, Fluzone What should I tell my health care provider before I take this medicine? They need to know if you have any of these conditions: -bleeding disorder like hemophilia -fever or infection -Guillain-Barre syndrome or other neurological problems -immune system problems -infection with the human immunodeficiency virus (HIV) or AIDS -low blood platelet counts -multiple sclerosis -an unusual or allergic reaction to influenza virus vaccine, eggs, chicken proteins, latex, gentamicin, other medicines, foods, dyes or preservatives -pregnant or trying to get pregnant -breast-feeding How should I use this medicine? This vaccine is for injection into a muscle. It is given by a health care professional. A copy of Vaccine Information Statements will be given before each vaccination. Read this sheet carefully each time. The sheet may change frequently. Talk to your pediatrician regarding the use of this medicine in children. Special care may be needed. Overdosage: If you think you have taken too much of this medicine contact a poison control center or emergency room at once. NOTE: This medicine is only for you. Do not share this medicine with others. What if I miss a dose? This does not apply. What may interact with this medicine? -chemotherapy or radiation therapy -medicines that lower your immune system like etanercept, anakinra, infliximab, and adalimumab -medicines that treat or prevent blood clots like warfarin -phenytoin -steroid medicines like prednisone or  cortisone -theophylline -vaccines This list may not describe all possible interactions. Give your health care provider a list of all the medicines, herbs, non-prescription drugs, or dietary supplements you use. Also tell them if you smoke, drink alcohol, or use illegal drugs. Some items may interact with your medicine. What should I watch for while using this medicine? Report any side effects that do not go away within 3 days to your doctor or health care professional. Call your health care provider if any unusual symptoms occur within 6 weeks of receiving this vaccine. You may still catch the flu, but the illness is not usually as bad. You cannot get the flu from the vaccine. The vaccine will not protect against colds or other illnesses that may cause fever. The vaccine is needed every year. What side effects may I notice from receiving this medicine? Side effects that you should report to your doctor or health care professional as soon as possible: -allergic reactions like skin rash, itching or hives, swelling of the face, lips, or tongue Side effects that usually do not require medical attention (report to your doctor or health care professional if they continue or are bothersome): -fever -headache -muscle aches and pains -pain, tenderness, redness, or swelling at site where injected -weak or tired This list may not describe all possible side effects. Call your doctor for medical advice about side effects. You may report side effects to FDA at 1-800-FDA-1088. Where should I keep my medicine? This vaccine is only given in a clinic, pharmacy, doctor's office, or other health care setting and will not be stored at home. NOTE: This sheet is a summary. It may not cover all possible information. If you have questions about this medicine, talk to your  doctor, pharmacist, or health care provider.  2017 Elsevier/Gold Standard (2007-11-06 09:30:40) Td Vaccine (Tetanus and Diphtheria): What You Need to  Know 1. Why get vaccinated? Tetanus  and diphtheria are very serious diseases. They are rare in the [place] today, but people who do become infected often have severe complications. Td vaccine is used to protect adolescents and adults from both of these diseases. Both tetanus and diphtheria are infections caused by bacteria. Diphtheria spreads from person to person through coughing or sneezing. Tetanus-causing bacteria enter the body through cuts, scratches, or wounds. TETANUS (lockjaw) causes painful muscle tightening and stiffness, usually all over the body.  It can lead to tightening of muscles in the head and neck so you can't open your mouth, swallow, or sometimes even breathe. Tetanus kills about 1 out of every 10 people who are infected even after receiving the best medical care. DIPHTHERIA can cause a thick coating to form in the back of the throat.  It can lead to breathing problems, paralysis, heart failure, and death. Before vaccines, as many as 200,000 cases of diphtheria and hundreds of cases of tetanus were reported in the [place] each year. Since vaccination began, reports of cases for both diseases have dropped by about 99%. 2. Td vaccine Td vaccine can protect adolescents and adults from tetanus and diphtheria. Td is usually given as a booster dose every 10 years but it can also be given earlier after a severe and dirty wound or burn. Another vaccine, called Tdap, which protects against pertussis in addition to tetanus and diphtheria, is sometimes recommended instead of Td vaccine. Your doctor or the person giving you the vaccine can give you more information. Td may safely be given at the same time as other vaccines. 3. Some people should not get this vaccine  A person who has ever had a life-threatening allergic reaction after a previous dose of any tetanus or diphtheria containing vaccine, OR has a severe allergy to any part of this vaccine, should not get Td  vaccine. Tell the person giving the vaccine about any severe allergies.  Talk to your doctor if you:  had severe pain or swelling after any vaccine containing diphtheria or tetanus,  ever had a condition called Guillain Barre Syndrome (GBS),  aren't feeling well on the day the shot is scheduled. 4. What are the risks from Td vaccine? With any medicine, including vaccines, there is a chance of side effects. These are usually mild and go away on their own. Serious reactions are also possible but are rare. Most people who get Td vaccine do not have any problems with it. Mild problems following Td vaccine: (Did not interfere with activities)  Pain where the shot was given (about 8 people in 10)  Redness or swelling where the shot was given (about 1 person in 4)  Mild fever (rare)  Headache (about 1 person in 4)  Tiredness (about 1 person in 4) Moderate problems following Td vaccine: (Interfered with activities, but did not require medical attention)  Fever over 102F (rare) Severe problems following Td vaccine: (Unable to perform usual activities; required medical attention)  Swelling, severe pain, bleeding and/or redness in the arm where the shot was given (rare). Problems that could happen after any vaccine:  People sometimes faint after a medical procedure, including vaccination. Sitting or lying down for about 15 minutes can help prevent fainting, and injuries caused by a fall. Tell your doctor if you feel dizzy, or have vision changes or  ringing in the ears.  Some people get severe pain in the shoulder and have difficulty moving the arm where a shot was given. This happens very rarely.  Any medication can cause a severe allergic reaction. Such reactions from a vaccine are very rare, estimated at fewer than 1 in a million doses, and would happen within a few minutes to a few hours after the vaccination. As with any medicine, there is a very remote chance of a vaccine causing a  serious injury or death. The safety of vaccines is always being monitored. For more information, visit: http://floyd.org/ 5. What if there is a serious reaction? What should I look for? Look for anything that concerns you, such as signs of a severe allergic reaction, very high fever, or unusual behavior. Signs of a severe allergic reaction can include hives, swelling of the face and throat, difficulty breathing, a fast heartbeat, dizziness, and weakness. These would usually start a few minutes to a few hours after the vaccination. What should I do?  If you think it is a severe allergic reaction or other emergency that can't wait, call 9-1-1 or get the person to the nearest hospital. Otherwise, call your doctor.  Afterward, the reaction should be reported to the Vaccine Adverse Event Reporting System (VAERS). Your doctor might file this report, or you can do it yourself through the VAERS web site at www.vaers.LAgents.no, or by calling 1-912 636 0833.  VAERS does not give medical advice. 6. The National Vaccine Injury Compensation Program The [employer] Vaccine Injury Compensation Program (VICP) is a federal program that was created to compensate people who may have been injured by certain vaccines. Persons who believe they may have been injured by a vaccine can learn about the program and about filing a claim by calling 1-786-818-0948 or visiting the VICP website at SpiritualWord.at. There is a time limit to file a claim for compensation. 7. How can I learn more?  Ask your doctor. He or she can give you the vaccine package insert or suggest other sources of information.  Call your local or state health department.  Contact the Centers for Disease Control and Prevention (CDC):  Call (206)325-5740 (1-800-CDC-INFO)  Visit CDC's website at PicCapture.uy CDC Td Vaccine VIS (08/03/15) This information is not intended to replace advice given to you by your health care  provider. Make sure you discuss any questions you have with your health care provider. Document Released: 02/05/2006 Document Revised: 12/30/2015 Document Reviewed: 12/30/2015 Elsevier Interactive Patient Education  2017 [employer].   -   Food Choices for Gastroesophageal Reflux Disease, Adult When you have gastroesophageal reflux disease (GERD), the foods you eat and your eating habits are very important. Choosing the right foods can help ease your discomfort. What guidelines do I need to follow?  Choose fruits, vegetables, whole grains, and low-fat dairy products.  Choose low-fat meat, fish, and poultry.  Limit fats such as oils, salad dressings, butter, nuts, and avocado.  Keep a food diary. This helps you identify foods that cause symptoms.  Avoid foods that cause symptoms. These may be different for everyone.  Eat small meals often instead of 3 large meals a day.  Eat your meals slowly, in a place where you are relaxed.  Limit fried foods.  Cook foods using methods other than frying.  Avoid drinking alcohol.  Avoid drinking large amounts of liquids with your meals.  Avoid bending over or lying down until 2-3 hours after eating. What foods are not recommended? These are some  foods and drinks that may make your symptoms worse: Vegetables  Tomatoes. Tomato juice. Tomato and spaghetti sauce. Chili peppers. Onion and garlic. Horseradish. Fruits  Oranges, grapefruit, and lemon (fruit and juice). Meats  High-fat meats, fish, and poultry. This includes hot dogs, ribs, ham, sausage, salami, and bacon. Dairy  Whole milk and chocolate milk. Sour cream. Cream. Butter. Ice cream. Cream cheese. Drinks  Coffee and tea. Bubbly (carbonated) drinks or energy drinks. Condiments  Hot sauce. Barbecue sauce. Sweets/Desserts  Chocolate and cocoa. Donuts. Peppermint and spearmint. Fats and Oils  High-fat foods. This includes [place] fries and potato chips. Other  Vinegar. Strong  spices. This includes black pepper, white pepper, red pepper, cayenne, curry powder, cloves, ginger, and chili powder. The items listed above may not be a complete list of foods and drinks to avoid. Contact your dietitian for more information.  This information is not intended to replace advice given to you by your health care provider. Make sure you discuss any questions you have with your health care provider. Document Released: 10/10/2011 Document Revised: 09/16/2015 Document Reviewed: 02/12/2013 Elsevier Interactive Patient Education  2017 [employer].

## 2016-06-06 NOTE — Progress Notes (Signed)
Cindy Stewart, is a 31 y.o. female  [id number]  [id number]  DOB - Jan 12, 1986  CC:  Chief Complaint  Patient presents with  . Establish Care  . Gastroesophageal Reflux       HPI: Cindy Stewart is a 31 y.o.[place] female here today to establish medical care.  Last seen by PA 12/17 for cp followup, stresstest neg., chronic complaints.  Here today w/ same.  She was recently seen in ed 03/24/17 for headache, no n/v/photophobia, CT head in ED negative. Per pt, ha come/go.  C/o of dizziness/body aches as well diffusely. Denies loc/fainting spells. She notes she eats healthy diet, fish/veg/fiber, etc.  Did not know she had to take vit d from recent labs, so never picked up.  Complains of ruq abd pains as well, states it "acid", worse when she goes to bed at night. Ask for acid pill. She denies etoh/tob/eating tomatoes.   Also requested to see eye doctor for blurry vision and needs an "eye exam".  She is here w/ her husband.   Patient has No headache currently, No chest pain, No abdominal pain - No Nausea, No new weakness tingling or numbness, No Cough - SOB.  Interpreter was used to communicate directly with patient for the entire encounter including providing detailed patient instructions.   Review of Systems: Per hpi, o/w all systems reviewed and negative.    No Known Allergies Past Medical History:  Diagnosis Date  . Hypotension   . Lupus    Current Outpatient Prescriptions on File Prior to Visit  Medication Sig Dispense Refill  . fluticasone (FLONASE) 50 MCG/ACT nasal spray Place 2 sprays into both nostrils daily. (Patient not taking: Reported on 05/25/2016) 16 g 6  . naproxen (NAPROSYN) 500 MG tablet Take 1 tablet (500 mg total) by mouth 2 (two) times daily with a meal. Prn pain (Patient not taking: Reported on 05/25/2016) 60 tablet 0   No current facility-administered medications on file prior to visit.    No family history on file. Social History   Social  History  . Marital status: Married    Spouse name: N/A  . Number of children: N/A  . Years of education: N/A   Occupational History  . Not on file.   Social History Main Topics  . Smoking status: Never Smoker  . Smokeless tobacco: Never Used  . Alcohol use No  . Drug use: No  . Sexual activity: Not on file   Other Topics Concern  . Not on file   Social History Narrative  . No narrative on file    Objective:   Vitals:   06/06/16 1046  BP: 136/89  Pulse: 74  Resp: 16  Temp: 98.4 F (36.9 C)    Filed Weights   06/06/16 1046  Weight: 88 lb 3.2 oz (40 kg)    BP Readings from Last 3 Encounters:  06/06/16 136/89  05/25/16 123/81  04/07/16 135/90   bmi 17.8  Physical Exam: Constitutional: Patient appears well-developed and well-nourished. No distress. AAOx3, very thin, small lady in no acute distress. HENT: Normocephalic, atraumatic, External right and left ear normal. Oropharynx is clear and moist.  bilat TMs clear. Eyes: Conjunctivae and EOM are normal. PERRL, no scleral icterus. Neck: Normal ROM. Neck supple. No JVD. No tracheal deviation. No thyromegaly. CVS: RRR, S1/S2 +, no murmurs, no gallops, no carotid bruit.   Pulmonary: Effort and breath sounds normal, no stridor, rhonchi, wheezes, rales.  Abdominal: Soft. BS +, no distension, tenderness, rebound or  guarding.  Musculoskeletal: Normal range of motion. No edema and no tenderness.  LE: bilat/ no c/c/e, pulses 2+ bilateral. Neuro: Alert.  muscle tone coordination wnl. No cranial nerve deficit grossly. Skin: Skin is warm and dry. No rash noted. Not diaphoretic. No erythema. No pallor. Psychiatric: Normal mood and affect. Behavior, judgment, thought content normal.  Lab Results  Component Value Date   WBC 5.1 03/26/2016   HGB 11.9 (L) 03/26/2016   HCT 34.8 (L) 03/26/2016   MCV 82.9 03/26/2016   PLT 178 03/26/2016   Lab Results  Component Value Date   CREATININE 0.43 (L) 03/26/2016   BUN 11  03/26/2016   NA 136 03/26/2016   K 3.6 03/26/2016   CL 108 03/26/2016   CO2 21 (L) 03/26/2016    Lab Results  Component Value Date   HGBA1C 5.2 06/06/2016   Lipid Panel  No results found for: CHOL, TRIG, HDL, CHOLHDL, VLDL, LDLCALC      [facility] 06/06/2016  Decreased Interest (No Data)  Down, Depressed, Hopeless (No Data)   Ct head 05/25/16 IMPRESSION: No acute intracranial pathology seen on CT.   Electronically Signed   By: Roanna RaiderJeffery  Chang M.D.   On: 05/25/2016 18:12  Assessment and plan:   1. Nonintractable headache, unspecified chronicity pattern, unspecified headache type Appears resolved, recent ct head 05/25/16 neg.  2. Anemia, unspecified type - may be contributing to her other sxs. - Iron, TIBC and Ferritin Panel  3. Moderate protein-calorie malnutrition (HCC) - bmi 17 - may be contributing to all her chronic somatic c/os, ie, dizziness, headaches, fatigue, body aches. - Prealbumin - will see if we have any more protein shakes in clinic to provided - recd pt increase her protein and fat intake, gaining 3-5lbs may help her considerably overall.  4. Gastroesophageal reflux disease without esophagitis - gerd diet discussed. - [facility] her on short course of ppi, but urea breath test unavailable for now. Trip pepcid bid and tums prn. - H. pylori breath test  - we currently do not have this test in clinic, will do next time here. Hold off on starting ppi today.  5. Diabetes mellitus screening - POCT glycosylated hemoglobin (Hb A1C)  6. Encounter for immunization - Flu Vaccine QUAD 36+ mos IM  7. Language barrier affecting health care Interpreter was used to communicate directly with patient for the entire encounter including providing detailed patient instructions.   8. Dizziness Currently not orthostatic, suspect malnutrition playing a part.  9. Vitamin D deficiency May be contributing to her body/bone  /muscle aches as well, renewed rx (never picked up), advised to pick up and take. - Vitamin D, Ergocalciferol, (DRISDOL) 50000 units CAPS capsule; Take 1 capsule (50,000 Units total) by mouth every 7 (seven) days.  Dispense: 15 capsule; Refill: 0  10. Encounter for screening for HIV - HIV antibody (with reflex)  11. Blurry vision, bilateral - Ambulatory referral to Ophthalmology   Return in about 1 month (around 07/04/2016), or if symptoms worsen or fail to improve, for dizzinesss/ malnutrition /papsmear.  The patient was given clear instructions to go to ER or return to medical center if symptoms don't improve, worsen or new problems develop. The patient verbalized understanding. The patient was told to call to get lab results if they haven't heard anything in the next week.    This note has been created with Education officer, environmentalDragon speech recognition software and smart phrase technology. Any transcriptional errors are  unintentional.   Pete Glatter, MD, MBA/MHA [facility] [place], [place] [phone]   06/06/2016, 12:55 PM

## 2016-06-07 ENCOUNTER — Encounter: Payer: Self-pay | Admitting: Internal Medicine

## 2016-06-07 LAB — IRON,TIBC AND FERRITIN PANEL
%SAT: 26 % (ref 11–50)
Ferritin: 9 ng/mL — ABNORMAL LOW (ref 10–154)
Iron: 102 ug/dL (ref 40–190)
TIBC: 398 ug/dL (ref 250–450)

## 2016-06-07 LAB — HIV ANTIBODY (ROUTINE TESTING W REFLEX): HIV: NONREACTIVE

## 2016-06-08 LAB — PREALBUMIN: Prealbumin: 30 mg/dL (ref 17–34)

## 2016-07-04 ENCOUNTER — Ambulatory Visit: Payer: Medicaid Other | Admitting: Internal Medicine

## 2016-07-17 ENCOUNTER — Encounter: Payer: Self-pay | Admitting: Internal Medicine

## 2016-07-17 ENCOUNTER — Ambulatory Visit: Payer: Medicaid Other | Attending: Internal Medicine | Admitting: Internal Medicine

## 2016-07-17 VITALS — BP 136/79 | HR 82 | Temp 98.0°F | Resp 16 | Wt 83.8 lb

## 2016-07-17 DIAGNOSIS — E440 Moderate protein-calorie malnutrition: Secondary | ICD-10-CM

## 2016-07-17 DIAGNOSIS — D649 Anemia, unspecified: Secondary | ICD-10-CM | POA: Diagnosis not present

## 2016-07-17 DIAGNOSIS — K219 Gastro-esophageal reflux disease without esophagitis: Secondary | ICD-10-CM | POA: Diagnosis not present

## 2016-07-17 DIAGNOSIS — R634 Abnormal weight loss: Secondary | ICD-10-CM

## 2016-07-17 DIAGNOSIS — E559 Vitamin D deficiency, unspecified: Secondary | ICD-10-CM | POA: Diagnosis not present

## 2016-07-17 DIAGNOSIS — M329 Systemic lupus erythematosus, unspecified: Secondary | ICD-10-CM | POA: Insufficient documentation

## 2016-07-17 NOTE — Patient Instructions (Addendum)
- over the counter vitamin D 5,000 IU daily. - for bone health.  - EAT MORE!!! Need to gain at least 5- 10lbs more.  High-Protein and High-Calorie Diet Eating high-protein and high-calorie foods can help you to gain weight, heal after an injury, and recover after an illness or surgery. What is my plan? The specific amount of daily protein and calories you need depends on:  Your body weight.  The reason this diet is recommended for you. Generally, a high-protein, high-calorie diet involves:  Eating 250-500 extra calories each day.  Making sure that 10-35% of your daily calories come from protein. Talk to your health care provider about how much protein and how many calories you need each day. Follow the diet as directed by your health care provider. What do I need to know about this diet?  Ask your health care provider if you should take a nutritional supplement.  Try to eat six small meals each day instead of three large meals.  Eat a balanced diet, including one food that is high in protein at each meal.  Keep nutritious snacks handy, such as nuts, trail mixes, dried fruit, and yogurt.  If you have kidney disease or diabetes, eating too much protein may put extra stress on your kidneys. Talk to your health care provider if you have either of those conditions. What are some high-protein foods? Grains  Quinoa. Bulgur wheat. Vegetables  Soybeans. Peas. Meats and Other Protein Sources  Beef, pork, and poultry. Fish and seafood. Eggs. Tofu. Textured vegetable protein (TVP). Peanut butter. Nuts and seeds. Dried beans. Protein powders. Dairy  Whole milk. Whole-milk yogurt. Powdered milk. Cheese. [employer] CorporationCottage Cheese. Eggnog. Beverages  High-protein supplement drinks. Soy milk. Other  Protein bars. The items listed above may not be a complete list of recommended foods or beverages. Contact your dietitian for more options.  What are some high-calorie foods? Grains  Pasta. Quick breads.  Muffins. Pancakes. Ready-to-eat cereal. Vegetables  Vegetables cooked in oil or butter. Fried potatoes. Fruits  Dried fruit. Fruit leather. Canned fruit in syrup. Fruit juice. Avocados. Meats and Other Protein Sources  Peanut butter. Nuts and seeds. Dairy  Heavy cream. Whipped cream. Cream cheese. Sour cream. Ice cream. Custard. Pudding. Beverages  Meal-replacement beverages. Nutrition shakes. Fruit juice. Sugar-sweetened soft drinks. Condiments  Salad dressing. Mayonnaise. Alfredo sauce. Fruit preserves or jelly. Honey. Syrup. Sweets/Desserts  Cake. Cookies. Pie. Pastries. Candy bars. Chocolate. Fats and Oils  Butter or margarine. Oil. Gravy. Other  Meal-replacement bars. The items listed above may not be a complete list of recommended foods or beverages. Contact your dietitian for more options.  What are some tips for including high-protein and high-calorie foods in my diet?  Add whole milk, half-and-half, or heavy cream to cereal, pudding, soup, or hot cocoa.  Add whole milk to instant breakfast drinks.  Add peanut butter to oatmeal or smoothies.  Add powdered milk to baked goods, smoothies, or milkshakes.  Add powdered milk, cream, or butter to mashed potatoes.  Add cheese to cooked vegetables.  Make whole-milk yogurt parfaits. Top them with granola, fruit, or nuts.  Add cottage cheese to your fruit.  Add avocados, cheese, or both to sandwiches or salads.  Add meat, poultry, or seafood to rice, pasta, casseroles, salads, and soups.  Use mayonnaise when making egg salad, chicken salad, or tuna salad.  Use peanut butter as a topping for pretzels, celery, or crackers.  Add beans to casseroles, dips, and spreads.  Add pureed beans to sauces  and soups.  Replace calorie-free drinks with calorie-containing drinks, such as milk and fruit juice. This information is not intended to replace advice given to you by your health care provider. Make sure you discuss any  questions you have with your health care provider. Document Released: 04/10/2005 Document Revised: 09/16/2015 Document Reviewed: 09/23/2013 Elsevier Interactive Patient Education  2017 [employer].

## 2016-07-17 NOTE — Progress Notes (Signed)
Cindy Stewart, is a 31 y.o. female  ZOX:[id number]:[id number]  [id number]:[id number]  DOB - 02/18/1986  Chief Complaint  Patient presents with  . Gynecologic Exam  . Headache        Subjective:   Cindy Stewart is a 31 y.o. female here today for a follow up visit., last seen 06/06/16 for presumed gerd, protein cal malnutrition. Since than, her husband was sick and required hospitalization, so she was stressed and not eating well. She has lost 5lbs since I last saw her.  She had nexplanon placed June 2015, so will need replacement soon.  She denies any abnml breast /nipple discharge/lumps/masses. She states she has intermittent bleeding, and would prefer not to do papsmear today.  She is here w/ husband.   Patient has No headache, No chest pain, No abdominal pain - No Nausea, No new weakness tingling or numbness, No Cough - SOB.   Interpreter was used to communicate directly with patient for the entire encounter including providing detailed patient instructions.  No problems updated.  ALLERGIES: No Known Allergies  PAST MEDICAL HISTORY: Past Medical History:  Diagnosis Date  . Hypotension   . Lupus     MEDICATIONS AT HOME: Prior to Admission medications   Medication Sig Start Date End Date Taking? Authorizing Provider  famotidine (PEPCID) 20 MG tablet Take 1 tablet (20 mg total) by mouth 2 (two) times daily. 06/06/16 06/06/17  Pete Glatterawn T Tylan Briguglio, MD  fluticasone (FLONASE) 50 MCG/ACT nasal spray Place 2 sprays into both nostrils daily. Patient not taking: Reported on 05/25/2016 04/07/16   Anders SimmondsAngela M McClung, PA-C  naproxen (NAPROSYN) 500 MG tablet Take 1 tablet (500 mg total) by mouth 2 (two) times daily with a meal. Prn pain Patient not taking: Reported on 05/25/2016 04/07/16   Anders SimmondsAngela M McClung, PA-C  Vitamin D, Ergocalciferol, (DRISDOL) 50000 units CAPS capsule Take 1 capsule (50,000 Units total) by mouth every 7 (seven) days. Patient not taking: Reported on 07/17/2016 06/06/16   Pete Glatterawn T  Jemia Fata, MD     Objective:   Vitals:   07/17/16 1354  BP: 136/79  Pulse: 82  Resp: 16  Temp: 98 F (36.7 C)  TempSrc: Oral  SpO2: 96%  Weight: 83 lb 12.8 oz (38 kg)   06/06/16 weight 88 lbs.  cma assisted w/ wellwoman exam.  Exam General appearance : Awake, alert, not in any distress. Speech Clear. Cachectic/malnourished appearing lady, appears older than stated age. HEENT: Atraumatic and Normocephalic, pupils equally reactive to light. Poor dentition. Neck: supple, no JVD.  Chest:Good air entry bilaterally, no added sounds. Breast /axilla: bilat nml appearance, not dippling noted. No palpable masses/nodules/nipple discharge noted on exam  CVS: S1 S2 regular, no murmurs/gallups or rubs. Abdomen: Bowel sounds active, Non tender and not distended with no gaurding, rigidity or rebound. Pelvic - deferred due to pt still on menses currently. Extremities: B/L Lower Ext shows no edema, both legs are warm to touch + Palpable nexplanon implant left arm. Neurology: Awake alert, and oriented X 3, CN II-XII grossly intact, Non focal Skin:No Rash  Data Review Lab Results  Component Value Date   HGBA1C 5.2 06/06/2016    Depression screen PHQ 2/9 06/06/2016  Decreased Interest (No Data)  Down, Depressed, Hopeless (No Data)      Assessment & Plan   1. Moderate protein-calorie malnutrition (HCC) Recent labs showed mild anemia, normolcytic, likely due to malnutrition prealb was nml Encouraged increase protein/fat intake Coupons for protein supplements provided. Fu in 1 month, if persist,  consider depression, which pt denies.  2. Vitamin D deficiency Pt complted rx , but not taking otc 5000iu vit D as recd. - will rechk levels, may need rx again, vs otc. - VITAMIN D 25 Hydroxy (Vit-D Deficiency, Fractures)  3. Weight loss See #1, noted family stressors.  4. Gastroesophageal reflux disease without esophagitis Continue pepcid hpylori screening today. - H. pylori breath  test  5. Pap smear  -deferred per pt request due to current menses.     Patient have been counseled extensively about nutrition and exercise  Return in about 4 weeks (around 08/14/2016) for pap /weight loss..  The patient was given clear instructions to go to ER or return to medical center if symptoms don't improve, worsen or new problems develop. The patient verbalized understanding. The patient was told to call to get lab results if they haven't heard anything in the next week.   This note has been created with Education officer, environmental. Any transcriptional errors are unintentional.   Pete Glatter, MD, MBA/MHA [facility] and [facility] [place], [place] [phone]   07/17/2016, 2:16 PM

## 2016-07-18 LAB — VITAMIN D 25 HYDROXY (VIT D DEFICIENCY, FRACTURES): Vit D, 25-Hydroxy: 28.4 ng/mL — ABNORMAL LOW (ref 30.0–100.0)

## 2016-07-19 LAB — H.PYLORI BREATH TEST (REFLEX): H. PYLORI BREATH TEST: NEGATIVE

## 2016-07-19 LAB — H. PYLORI BREATH TEST

## 2016-08-14 ENCOUNTER — Ambulatory Visit: Payer: Medicaid Other | Admitting: Internal Medicine

## 2016-09-07 ENCOUNTER — Encounter: Payer: Self-pay | Admitting: Internal Medicine

## 2016-09-08 ENCOUNTER — Encounter: Payer: Self-pay | Admitting: Internal Medicine

## 2016-09-11 ENCOUNTER — Encounter: Payer: Self-pay | Admitting: Internal Medicine

## 2016-10-19 ENCOUNTER — Ambulatory Visit: Payer: Medicaid Other | Attending: Family Medicine | Admitting: Family Medicine

## 2016-10-19 VITALS — BP 104/72 | HR 73 | Temp 98.4°F | Resp 18 | Ht 59.0 in | Wt 84.6 lb

## 2016-10-19 DIAGNOSIS — G43009 Migraine without aura, not intractable, without status migrainosus: Secondary | ICD-10-CM | POA: Diagnosis not present

## 2016-10-19 DIAGNOSIS — J069 Acute upper respiratory infection, unspecified: Secondary | ICD-10-CM | POA: Insufficient documentation

## 2016-10-19 DIAGNOSIS — R51 Headache: Secondary | ICD-10-CM | POA: Diagnosis present

## 2016-10-19 MED ORDER — GUAIFENESIN-DM 100-10 MG/5ML PO SYRP: 5.0000 mL | ORAL_SOLUTION | ORAL | 0 refills | Status: DC | PRN

## 2016-10-19 MED ORDER — ASPIRIN-ACETAMINOPHEN-CAFFEINE 250-250-65 MG PO TABS: ORAL_TABLET | ORAL | 0 refills | Status: DC

## 2016-10-19 MED ORDER — IBUPROFEN 600 MG PO TABS: ORAL_TABLET | ORAL | 0 refills | Status: DC

## 2016-10-19 MED ORDER — FLUTICASONE PROPIONATE 50 MCG/ACT NA SUSP: 2.0000 | Freq: Every day | NASAL | 6 refills | Status: DC

## 2016-10-19 NOTE — Patient Instructions (Addendum)
Schedule Nexplanon removal with another provider.

## 2016-10-19 NOTE — Progress Notes (Signed)
Subjective:  Patient ID: Cindy CleverlyBishnu Stewart, female    DOB: 02/16/1986  Age: 31 y.o. MRN: [id number]  CC: Establish Care   HPI Cindy Stewart presents for headache and URI complaint. Patient complains of symptoms of a URI. Symptoms include cough. Onset of symptoms was 3 days ago, unchanged since that time. She also c/o achiness and nausea without vomiting for the past 3 days .  She is drinking moderate amounts of fluids. Evaluation to date: none. Treatment to date: none. Complaints of headache symptoms began about several months ago and are infrequent.  The headache do not seem to be related to any time of the day. The headaches are bilateral but worse on  left-side.   Recently, the headaches are stable.  Precipitating factors include none which have been determined. The headaches are usually not preceded by an aura. Associated neurologic symptoms which are present include: nausea. The patient denies dizziness, loss of balance, muscle weakness, vision problems and or vomiting. Other associated symptoms include: none.  Home treatment has included nothing. History of nexplanon use several years ago. Reports wanting to have nexplanon removed   Outpatient Medications Prior to Visit  Medication Sig Dispense Refill  . famotidine (PEPCID) 20 MG tablet Take 1 tablet (20 mg total) by mouth 2 (two) times daily. (Patient not taking: Reported on 10/19/2016) 60 tablet 1  . Vitamin D, Ergocalciferol, (DRISDOL) 50000 units CAPS capsule Take 1 capsule (50,000 Units total) by mouth every 7 (seven) days. (Patient not taking: Reported on 07/17/2016) 15 capsule 0  . fluticasone (FLONASE) 50 MCG/ACT nasal spray Place 2 sprays into both nostrils daily. (Patient not taking: Reported on 05/25/2016) 16 g 6  . naproxen (NAPROSYN) 500 MG tablet Take 1 tablet (500 mg total) by mouth 2 (two) times daily with a meal. Prn pain (Patient not taking: Reported on 05/25/2016) 60 tablet 0   No facility-administered medications prior to  visit.     ROS Review of Systems  Constitutional: Negative.   HENT: Positive for rhinorrhea.   Eyes: Negative.   Respiratory: Positive for cough.   Cardiovascular: Negative.   Gastrointestinal: Negative.   Skin:       Left arm nexplanon  Neurological: Positive for headaches.    Objective:  BP 104/72 (BP Location: Left Arm, Patient Position: Sitting, Cuff Size: Normal)   Pulse 73   Temp 98.4 F (36.9 C) (Oral)   Resp 18   Ht 4\' 11"  (1.499 m)   Wt 84 lb 9.6 oz (38.4 kg)   SpO2 100%   BMI 17.09 kg/m   BP/Weight 10/19/2016 07/17/2016 06/06/2016  Systolic BP 104 136 136  Diastolic BP 72 79 89  Wt. (Lbs) 84.6 83.8 88.2  BMI 17.09 16.93 17.81    Physical Exam  Eyes: Conjunctivae are normal. Pupils are equal, round, and reactive to light.  Neck: No JVD present.  Cardiovascular: Normal rate, regular rhythm, normal heart sounds and intact distal pulses.   Pulmonary/Chest: Effort normal and breath sounds normal.  Abdominal: Soft. Bowel sounds are normal.  Skin: Skin is warm and dry.  Nursing note and vitals reviewed.   Assessment & Plan:   Problem List Items Addressed This Visit    None    Visit Diagnoses    Viral upper respiratory tract infection    -  Primary   Relevant Medications   guaiFENesin-dextromethorphan (ROBITUSSIN DM) 100-10 MG/5ML syrup   fluticasone (FLONASE) 50 MCG/ACT nasal spray   Migraine without aura and without status migrainosus, not intractable  Relevant Medications   aspirin-acetaminophen-caffeine (EXCEDRIN MIGRAINE) 250-250-65 MG tablet   ibuprofen (ADVIL,MOTRIN) 600 MG tablet      -Schedule PAP and nexplanon removal with appropriate provider.       Meds ordered this encounter  Medications  . guaiFENesin-dextromethorphan (ROBITUSSIN DM) 100-10 MG/5ML syrup    Sig: Take 5 mLs by mouth every 4 (four) hours as needed for cough.    Dispense:  236 mL    Refill:  0    Order Specific Question:   Supervising Provider    Answer:   Quentin Angst [id number]  . fluticasone (FLONASE) 50 MCG/ACT nasal spray    Sig: Place 2 sprays into both nostrils daily.    Dispense:  16 g    Refill:  6    Order Specific Question:   Supervising Provider    Answer:   Quentin Angst [id number]  . aspirin-acetaminophen-caffeine (EXCEDRIN MIGRAINE) 250-250-65 MG tablet    Sig: TAKE 1 TABLET EVERY 12 HOURS AS NEEDED.    Dispense:  30 tablet    Refill:  0    Order Specific Question:   Supervising Provider    Answer:   Quentin Angst [id number]  . ibuprofen (ADVIL,MOTRIN) 600 MG tablet    Sig: TAKE 1 TABLET EVERY 8 HOURS AS NEEDED WITH FOOD.    Dispense:  30 tablet    Refill:  0    Order Specific Question:   Supervising Provider    Answer:   Quentin Angst [id number]    Follow-up: Return in about 2 weeks (around 11/02/2016), or if symptoms worsen or fail to improve, for PAP .   Lizbeth Bark FNP

## 2016-10-19 NOTE — Progress Notes (Signed)
Patient is here for headaches coughing

## 2016-11-15 ENCOUNTER — Encounter: Payer: Self-pay | Admitting: Family Medicine

## 2016-11-15 ENCOUNTER — Ambulatory Visit: Payer: Medicaid Other | Attending: Family Medicine | Admitting: Family Medicine

## 2016-11-15 ENCOUNTER — Other Ambulatory Visit (HOSPITAL_COMMUNITY)
Admission: RE | Admit: 2016-11-15 | Discharge: 2016-11-15 | Disposition: A | Discharge status: Home / Self Care | Payer: Medicaid Other | Source: Ambulatory Visit | Attending: Family Medicine | Admitting: Family Medicine

## 2016-11-15 VITALS — BP 139/87 | HR 78 | Temp 98.5°F | Resp 18 | Ht 59.0 in | Wt 86.2 lb

## 2016-11-15 DIAGNOSIS — Z8639 Personal history of other endocrine, nutritional and metabolic disease: Secondary | ICD-10-CM | POA: Diagnosis not present

## 2016-11-15 DIAGNOSIS — Z7982 Long term (current) use of aspirin: Secondary | ICD-10-CM | POA: Insufficient documentation

## 2016-11-15 DIAGNOSIS — Z01419 Encounter for gynecological examination (general) (routine) without abnormal findings: Secondary | ICD-10-CM | POA: Diagnosis not present

## 2016-11-15 DIAGNOSIS — E559 Vitamin D deficiency, unspecified: Secondary | ICD-10-CM | POA: Diagnosis not present

## 2016-11-15 DIAGNOSIS — Z975 Presence of (intrauterine) contraceptive device: Secondary | ICD-10-CM | POA: Diagnosis not present

## 2016-11-15 DIAGNOSIS — N926 Irregular menstruation, unspecified: Secondary | ICD-10-CM | POA: Insufficient documentation

## 2016-11-15 DIAGNOSIS — Z79899 Other long term (current) drug therapy: Secondary | ICD-10-CM | POA: Insufficient documentation

## 2016-11-15 LAB — POCT URINE PREGNANCY: PREG TEST UR: NEGATIVE

## 2016-11-15 NOTE — Progress Notes (Signed)
Subjective:  Patient ID: Cindy CleverlyBishnu Dygert, female    DOB: 03/24/1986  Age: 31 y.o. MRN: [id number]  CC: Gynecologic Exam    HPI Cindy Stewart presents for well woman visit with gynecological examination. Patient speaks Nepali, interpreter services used  She denies any family history or breast or gynecological cancers. She denies any lumps,nipple discharge, denting or dimpling of the breast. She does not perform monthly SBE. She is a non-smoker.She requestsSTI testing with examination. She denies any vaginal discharge, lesions or dysuria. Reports no sexual partner within the last 3 months. She has nexplanon implant in place. Request to have nexplanon implant removed. Reports history or irregular menstrual cycles occurring every two to three months.   Outpatient Medications Prior to Visit  Medication Sig Dispense Refill  . aspirin-acetaminophen-caffeine (EXCEDRIN MIGRAINE) 250-250-65 MG tablet TAKE 1 TABLET EVERY 12 HOURS AS NEEDED. 30 tablet 0  . famotidine (PEPCID) 20 MG tablet Take 1 tablet (20 mg total) by mouth 2 (two) times daily. (Patient not taking: Reported on 10/19/2016) 60 tablet 1  . fluticasone (FLONASE) 50 MCG/ACT nasal spray Place 2 sprays into both nostrils daily. 16 g 6  . guaiFENesin-dextromethorphan (ROBITUSSIN DM) 100-10 MG/5ML syrup Take 5 mLs by mouth every 4 (four) hours as needed for cough. 236 mL 0  . ibuprofen (ADVIL,MOTRIN) 600 MG tablet TAKE 1 TABLET EVERY 8 HOURS AS NEEDED WITH FOOD. 30 tablet 0  . Vitamin D, Ergocalciferol, (DRISDOL) 50000 units CAPS capsule Take 1 capsule (50,000 Units total) by mouth every 7 (seven) days. (Patient not taking: Reported on 07/17/2016) 15 capsule 0   No facility-administered medications prior to visit.     ROS Review of Systems  Constitutional: Negative.   Respiratory: Negative.   Cardiovascular: Negative.   Gastrointestinal: Negative.   Skin: Negative.   Psychiatric/Behavioral: Negative.     Objective:  BP 139/87 (BP  Location: Left Arm, Patient Position: Sitting, Cuff Size: Normal)   Pulse 78   Temp 98.5 F (36.9 C) (Oral)   Resp 18   Ht 4\' 11"  (1.499 m)   Wt 86 lb 3.2 oz (39.1 kg)   SpO2 100%   BMI 17.41 kg/m   BP/Weight 11/15/2016 10/19/2016 07/17/2016  Systolic BP 139 104 136  Diastolic BP 87 72 79  Wt. (Lbs) 86.2 84.6 83.8  BMI 17.41 17.09 16.93     Physical Exam  Constitutional: She appears well-developed and well-nourished.  Cardiovascular: Normal rate, regular rhythm, normal heart sounds and intact distal pulses.   Pulmonary/Chest: Effort normal and breath sounds normal. Right breast exhibits no mass, no nipple discharge, no skin change and no tenderness. Left breast exhibits no mass, no nipple discharge, no skin change and no tenderness.  Abdominal: Soft. Bowel sounds are normal. There is no tenderness.  Genitourinary: Cervix exhibits no discharge. No vaginal discharge found.  Skin: Skin is warm and dry.  Psychiatric: She has a normal mood and affect.  Nursing note and vitals reviewed.   Assessment & Plan:   Problem List Items Addressed This Visit    None    Visit Diagnoses    Well woman exam with routine gynecological exam    -  Primary   Relevant Orders   Cytology - PAP [place] (Completed)   HEP, RPR, HIV Panel (Completed)   HSV(herpes simplex vrs) 1+2 ab-IgG (Completed)   Irregular menstrual bleeding       Relevant Orders   POCT urine pregnancy (Completed)   Nexplanon in place  Referral for nexplanon removal   Relevant Orders   Ambulatory referral to Family Practice   History of vitamin D deficiency       Relevant Orders   Vitamin D, 25-hydroxy (Completed)       Follow-up: Return As needed.   Lizbeth BarkMandesia R Hairston FNP

## 2016-11-15 NOTE — Progress Notes (Signed)
Patient is here for PAP 

## 2016-11-16 LAB — CERVICOVAGINAL ANCILLARY ONLY
Bacterial vaginitis: POSITIVE — AB
CANDIDA VAGINITIS: NEGATIVE
Chlamydia: NEGATIVE
Neisseria Gonorrhea: NEGATIVE
Trichomonas: NEGATIVE

## 2016-11-16 LAB — HEP, RPR, HIV PANEL
HIV Screen 4th Generation wRfx: NONREACTIVE
Hepatitis B Surface Ag: NEGATIVE
RPR Ser Ql: NONREACTIVE

## 2016-11-16 LAB — HSV(HERPES SIMPLEX VRS) I + II AB-IGG: HSV 1 GLYCOPROTEIN G AB, IGG: 43.6 {index} — AB (ref 0.00–0.90)

## 2016-11-16 LAB — VITAMIN D 25 HYDROXY (VIT D DEFICIENCY, FRACTURES): Vit D, 25-Hydroxy: 18.9 ng/mL — ABNORMAL LOW (ref 30.0–100.0)

## 2016-11-17 LAB — CYTOLOGY - PAP
Diagnosis: NEGATIVE
HPV: NOT DETECTED

## 2016-11-20 ENCOUNTER — Other Ambulatory Visit: Payer: Self-pay | Admitting: Family Medicine

## 2016-11-20 DIAGNOSIS — B9689 Other specified bacterial agents as the cause of diseases classified elsewhere: Secondary | ICD-10-CM

## 2016-11-20 DIAGNOSIS — N760 Acute vaginitis: Principal | ICD-10-CM

## 2016-11-20 MED ORDER — METRONIDAZOLE 500 MG PO TABS: 500.0000 mg | ORAL_TABLET | Freq: Two times a day (BID) | ORAL | 0 refills | Status: DC

## 2016-11-21 LAB — CERVICOVAGINAL ANCILLARY ONLY: Herpes: NEGATIVE

## 2016-11-22 ENCOUNTER — Telehealth: Payer: Self-pay | Admitting: Family Medicine

## 2016-11-22 NOTE — Telephone Encounter (Signed)
Referral was placed at pt.'s  last office visit with family medicine for Nexplanon removal. Will refer to our referral specialist.

## 2016-11-22 NOTE — Telephone Encounter (Signed)
Pt's husband came in requesting a referral for pt to have Nexplanon removed. Please f/u with pt. Thank you.

## 2016-11-23 ENCOUNTER — Other Ambulatory Visit: Payer: Self-pay | Admitting: Family Medicine

## 2016-11-23 NOTE — Telephone Encounter (Signed)
Medical Assistant used [employer] to contact patient.  Interpreter Name: Lewis MoccasinGanesh Interpreter #: [phone] Patient was not available, [employer] left patient a voicemail. Voicemail states to give a call back to [place] with [facility] at [phone].

## 2016-11-23 NOTE — Telephone Encounter (Signed)
Good Morning  [facility]-16 @ 10;30am  . Mail a letter to patient with appointment details

## 2016-11-23 NOTE — Telephone Encounter (Signed)
Please call and notify patient of upcoming appointment.

## 2016-11-30 ENCOUNTER — Ambulatory Visit (INDEPENDENT_AMBULATORY_CARE_PROVIDER_SITE_OTHER): Payer: Medicaid Other | Admitting: Family Medicine

## 2016-11-30 VITALS — BP 132/80 | HR 105 | Temp 98.4°F | Ht 59.0 in | Wt 85.4 lb

## 2016-11-30 DIAGNOSIS — Z308 Encounter for other contraceptive management: Secondary | ICD-10-CM

## 2016-11-30 DIAGNOSIS — Z30013 Encounter for initial prescription of injectable contraceptive: Secondary | ICD-10-CM | POA: Diagnosis present

## 2016-11-30 LAB — POCT URINE PREGNANCY: Preg Test, Ur: NEGATIVE

## 2016-11-30 MED ORDER — MEDROXYPROGESTERONE ACETATE 150 MG/ML IM SUSP
150.0000 mg | Freq: Once | INTRAMUSCULAR | Status: AC
  Administered 2016-11-30: 150 mg via INTRAMUSCULAR

## 2016-11-30 NOTE — Progress Notes (Signed)
   Pt in for Depo Provera injection.  Pt tolerated Depo injection. Depo given left upper outer quadrant.  Next injection due Oct 25-Mar 01, 2017.  Reminder card given. Clovis PuMartin, Deysi Soldo L, RN

## 2016-12-01 NOTE — Progress Notes (Signed)
Patient is here for Nexplanon removal. Interpreter surface use this patient speaks [place]. Has had Nexplanon. It is 2 months expired. She does not want to go back to that for birth control because she had so much irregular bleeding. She would like to try Depo-Provera shot. PROCEDURE NOTE: NEXPLANON  REMOVAL Patient given informed consent and signed copy in the chart. LEFT arm area prepped and draped in the usual sterile fashion. Three cc of lidocaine without epinephrine 1% used for local anesthesia. A small stab incision was made close to the nexplanon with scalpel. Hemostats were used to withdraw the nexplanon. A small bandage was applied over a steri strip  No complications.Patient given follow up instructions should she experience redness, swelling at sight or fever in the next 24 hours. Patient was reminded this totally removes her nexplanon contraceptive devise. (she can now potentially conceive)

## 2017-01-01 ENCOUNTER — Encounter (HOSPITAL_COMMUNITY): Payer: Self-pay | Admitting: *Deleted

## 2017-01-01 DIAGNOSIS — Z79899 Other long term (current) drug therapy: Secondary | ICD-10-CM | POA: Insufficient documentation

## 2017-01-01 DIAGNOSIS — R0989 Other specified symptoms and signs involving the circulatory and respiratory systems: Secondary | ICD-10-CM | POA: Diagnosis present

## 2017-01-01 DIAGNOSIS — R1310 Dysphagia, unspecified: Secondary | ICD-10-CM | POA: Insufficient documentation

## 2017-01-01 DIAGNOSIS — E119 Type 2 diabetes mellitus without complications: Secondary | ICD-10-CM | POA: Insufficient documentation

## 2017-01-01 DIAGNOSIS — Z7982 Long term (current) use of aspirin: Secondary | ICD-10-CM | POA: Insufficient documentation

## 2017-01-01 NOTE — ED Triage Notes (Signed)
Per Korea[place]ali interpreter: pt has been having difficulty swallowing for 2 years that makes pt feel SOB. Reports "a ball is stuck in my throat." Hx of "gastric problems"

## 2017-01-02 ENCOUNTER — Emergency Department (HOSPITAL_COMMUNITY)
Admission: EM | Admit: 2017-01-02 | Discharge: 2017-01-02 | Disposition: A | Discharge status: Home / Self Care | Payer: Medicaid Other | Attending: Emergency Medicine | Admitting: Emergency Medicine

## 2017-01-02 DIAGNOSIS — R1310 Dysphagia, unspecified: Secondary | ICD-10-CM

## 2017-01-02 MED ORDER — RANITIDINE HCL 150 MG PO CAPS: 150.0000 mg | ORAL_CAPSULE | Freq: Two times a day (BID) | ORAL | 1 refills | Status: DC

## 2017-01-02 NOTE — ED Notes (Signed)
Patient c/o cold type sx. With sorethroat and non productive cough onset yest . States she feels like something is stuck in her throat. interrupter  Line used.

## 2017-01-02 NOTE — ED Provider Notes (Signed)
[facility]-EMERGENCY DEPT Provider Note   CSN: [id number] Arrival date & time: 01/01/17  2225     History   Chief Complaint Chief Complaint  Patient presents with  . Dysphagia    HPI Cindy Stewart is a 31 y.o. female with history of hypertension and lupus who presents emergency department for 2 years of dysphasia. The patient speaks Nepali and an interpretor was used. Patient states that she has had that she describes as a "ball stuck in my throat". She has been on ranitidine in the past for this without relief. She states she ran out of this one month ago. Over the last day she has felt the symptoms return. She states that occasionally he'll be difficult to swallow which makes her feel short of breath for patient's second but then goes away. She is not followed by a doctor for this. Patient states she has had an endoscopy while in [place] that was benign. She has not taken anything for this. She denies relation to food, or temporality. Denies fever, chills, inability to control secretions, N/V, abdominal pain, chest pain, doe, cough, voice change, dental disease, trauma. Only other symptoms are the runny nose and sneezing that have been ongoing for several months.   HPI  Past Medical History:  Diagnosis Date  . Hypotension   . Lupus     Patient Active Problem List   Diagnosis Date Noted  . Diabetes mellitus screening 06/06/2016  . Hypotension     History reviewed. No pertinent surgical history.  OB History    Gravida Para Term Preterm AB Living   1             SAB TAB Ectopic Multiple Live Births                   Home Medications    Prior to Admission medications   Medication Sig Start Date End Date Taking? Authorizing Provider  aspirin-acetaminophen-caffeine (EXCEDRIN MIGRAINE) [phone] MG tablet TAKE 1 TABLET EVERY 12 HOURS AS NEEDED. 10/19/16   Lizbeth Bark, FNP  famotidine (PEPCID) 20 MG tablet Take 1 tablet (20 mg total) by mouth 2 (two) times daily. Patient  not taking: Reported on 10/19/2016 06/06/16 06/06/17  Pete Glatter, MD  fluticasone [facility]) 50 MCG/ACT nasal spray Place 2 sprays into both nostrils daily. 10/19/16   Lizbeth Bark, FNP  guaiFENesin-dextromethorphan (ROBITUSSIN DM) 100-10 MG/5ML syrup Take 5 mLs by mouth every 4 (four) hours as needed for cough. 10/19/16   Lizbeth Bark, FNP  ibuprofen (ADVIL,MOTRIN) 600 MG tablet TAKE 1 TABLET EVERY 8 HOURS AS NEEDED WITH FOOD. 10/19/16   Lizbeth Bark, FNP  metroNIDAZOLE (FLAGYL) 500 MG tablet Take 1 tablet (500 mg total) by mouth 2 (two) times daily. 11/20/16   Lizbeth Bark, FNP  ranitidine (ZANTAC) 150 MG capsule Take 1 capsule (150 mg total) by mouth 2 (two) times daily. 01/02/17   Sevanna Ballengee, Elmer Sow, PA-C  Vitamin D, Ergocalciferol, (DRISDOL) 50000 units CAPS capsule Take 1 capsule (50,000 Units total) by mouth every 7 (seven) days. Patient not taking: Reported on 07/17/2016 06/06/16   Pete Glatter, MD    Family History No family history on file.  Social History Social History  Substance Use Topics  . Smoking status: Never Smoker  . Smokeless tobacco: Never Used  . Alcohol use No     Allergies   Patient has no known allergies.   Review of Systems Review of Systems  All other systems reviewed  and are negative.    Physical Exam Updated Vital Signs BP (!) 156/96 (BP Location: Right Arm)   Pulse 71   Temp 97.9 F (36.6 C) (Oral)   Resp 16   LMP 12/25/2016   SpO2 100%   Physical Exam  Constitutional: She appears well-developed and well-nourished.  HENT:  Head: Normocephalic and atraumatic.  Right Ear: External ear normal.  Left Ear: External ear normal.  Nose: Nose normal.  Mouth/Throat: Uvula is midline, oropharynx is clear and moist and mucous membranes are normal. No tonsillar exudate.  The patient has normal phonation and is in control of secretions. No stridor.  Midline uvula without edema. Soft palate rises symmetrically. No  tonsillar erythema or exudates. No PTA. Tongue protrusion is normal. No trismus. No creptius on neck palpation and patient has good dentition. No gingival erythema or fluctuance noted. Mucus membranes moist.   Eyes: Pupils are equal, round, and reactive to light. Right eye exhibits no discharge. Left eye exhibits no discharge. No scleral icterus.  Neck: Trachea normal and normal range of motion. Neck supple. No spinous process tenderness present. No neck rigidity. No tracheal deviation and normal range of motion present.  Cardiovascular: Normal rate, regular rhythm and intact distal pulses.   No murmur heard. Pulses:      Radial pulses are 2+ on the right side, and 2+ on the left side.       Dorsalis pedis pulses are 2+ on the right side, and 2+ on the left side.       Posterior tibial pulses are 2+ on the right side, and 2+ on the left side.  No lower extremity swelling or edema. Calves symmetric in size bilaterally.  Pulmonary/Chest: Effort normal and breath sounds normal. No respiratory distress. She exhibits no tenderness.  Abdominal: Soft. Bowel sounds are normal. There is no tenderness. There is no rebound and no guarding.  Musculoskeletal: She exhibits no edema.  Lymphadenopathy:    She has no cervical adenopathy.  Neurological: She is alert.  Skin: Skin is warm and dry. No rash noted. She is not diaphoretic.  Psychiatric: She has a normal mood and affect.  Nursing note and vitals reviewed.    ED Treatments / Results  Labs (all labs ordered are listed, but only abnormal results are displayed) Labs Reviewed - No data to display  EKG  EKG Interpretation None       Radiology No results found.  Procedures Procedures (including critical care time)  Medications Ordered in ED Medications - No data to display   Initial Impression / Assessment and Plan / ED Course  I have reviewed the triage vital signs and the nursing notes.  Pertinent labs & imaging results that were  available during my care of the patient were reviewed by me and considered in my medical decision making (see chart for details).     31 year old Nepali speaking female who presents to the ED today for dysphagia x 2 years. Symptoms previously controlled by Ranitidine but has ran out. No other symptoms. The patient has normal phonation and is in control of secretions. She is able to tolerate PO. Lung sounds clear. No abdominal pain. No stridor or trismus. Midline uvula and no tonsillar erythema or exudate. No PTA.  No creptius on neck palpation and patient has good dentition. There is no facial or neck swelling.  Susepct symptoms may be due to GERD vs Esophageal spasms. Will restart the patient on Zantac and refer to GI. Specific return  precautions discussed. Pt able to drink water in ED without difficulty with intact air way. The evaluation does not show pathology that would require ongoing emergent intervention or inpatient treatment.  The patient verbalized understanding and agreement with plan. All questions answered. No further questions at this time. The patient is hemodynamically stable, mentating appropriately and appears safe for discharge.  Final Clinical Impressions(s) / ED Diagnoses   Final diagnoses:  Dysphagia, unspecified type    New Prescriptions New Prescriptions   RANITIDINE (ZANTAC) 150 MG CAPSULE    Take 1 capsule (150 mg total) by mouth 2 (two) times daily.     Jacinto Halim, PA-C 01/02/17 1610    Ward, Layla Maw, DO 01/02/17 0700

## 2017-01-02 NOTE — Discharge Instructions (Signed)
I am restarting you on the medication you have taken previously for this. Please follow up with GI for further evaluation. If you develop worsening or new concerning symptoms you can return to the emergency department for re-evaluation.    Additional Information:  Your vital signs today were: BP (!) 156/96 (BP Location: Right Arm)    Pulse 71    Temp 97.9 F (36.6 C) (Oral)    Resp 16    LMP 12/25/2016    SpO2 100%  If your blood pressure (BP) was elevated above 135/85 this visit, please have this repeated by your doctor within one month. ---------------

## 2017-01-18 ENCOUNTER — Inpatient Hospital Stay: Payer: Medicaid Other

## 2017-03-14 ENCOUNTER — Ambulatory Visit: Payer: Medicaid Other

## 2019-08-29 DIAGNOSIS — O139 Gestational [pregnancy-induced] hypertension without significant proteinuria, unspecified trimester: Secondary | ICD-10-CM

## 2019-08-29 DIAGNOSIS — O24419 Gestational diabetes mellitus in pregnancy, unspecified control: Secondary | ICD-10-CM

## 2020-06-01 ENCOUNTER — Encounter: Payer: Self-pay | Admitting: Critical Care Medicine

## 2020-06-01 ENCOUNTER — Other Ambulatory Visit: Payer: Self-pay

## 2020-06-01 ENCOUNTER — Ambulatory Visit: Payer: Medicaid Other | Attending: Critical Care Medicine | Admitting: Critical Care Medicine

## 2020-06-01 ENCOUNTER — Other Ambulatory Visit: Payer: Self-pay | Admitting: Critical Care Medicine

## 2020-06-01 ENCOUNTER — Other Ambulatory Visit (HOSPITAL_COMMUNITY)
Admission: RE | Admit: 2020-06-01 | Discharge: 2020-06-01 | Disposition: A | Discharge status: Home / Self Care | Payer: Medicaid Other | Source: Ambulatory Visit | Attending: Critical Care Medicine | Admitting: Critical Care Medicine

## 2020-06-01 VITALS — BP 156/96 | HR 96 | Temp 98.5°F | Resp 16 | Wt 100.0 lb

## 2020-06-01 DIAGNOSIS — Z124 Encounter for screening for malignant neoplasm of cervix: Secondary | ICD-10-CM | POA: Insufficient documentation

## 2020-06-01 DIAGNOSIS — Z113 Encounter for screening for infections with a predominantly sexual mode of transmission: Secondary | ICD-10-CM | POA: Diagnosis not present

## 2020-06-01 DIAGNOSIS — I10 Essential (primary) hypertension: Secondary | ICD-10-CM | POA: Diagnosis not present

## 2020-06-01 DIAGNOSIS — E559 Vitamin D deficiency, unspecified: Secondary | ICD-10-CM | POA: Insufficient documentation

## 2020-06-01 DIAGNOSIS — Z131 Encounter for screening for diabetes mellitus: Secondary | ICD-10-CM | POA: Diagnosis not present

## 2020-06-01 DIAGNOSIS — K219 Gastro-esophageal reflux disease without esophagitis: Secondary | ICD-10-CM

## 2020-06-01 DIAGNOSIS — Z1159 Encounter for screening for other viral diseases: Secondary | ICD-10-CM

## 2020-06-01 DIAGNOSIS — Z720 Tobacco use: Secondary | ICD-10-CM

## 2020-06-01 HISTORY — DX: Encounter for screening for malignant neoplasm of cervix: Z12.4

## 2020-06-01 MED ORDER — OMEPRAZOLE 20 MG PO CPDR: 20.0000 mg | DELAYED_RELEASE_CAPSULE | Freq: Every day | ORAL | 4 refills | Status: DC

## 2020-06-01 MED ORDER — VALSARTAN-HYDROCHLOROTHIAZIDE 80-12.5 MG PO TABS: 1.0000 | ORAL_TABLET | Freq: Every day | ORAL | 2 refills | Status: DC

## 2020-06-01 MED FILL — OMEPRAZOLE 20 MG CAP: 20 | 30 days supply | Qty: 30 | Fill #0

## 2020-06-01 MED FILL — VALSARTAN-HCTZ 80-12.5 MG T: 80-12.5 | 30 days supply | Qty: 30 | Fill #0

## 2020-06-01 NOTE — Assessment & Plan Note (Signed)
Cervical cancer screening done with Pap smear also check and screen for STD

## 2020-06-01 NOTE — Progress Notes (Signed)
Head aches, dizziness, and  back pain  Bhagat [id number]

## 2020-06-01 NOTE — Assessment & Plan Note (Signed)
Reflux symptoms Reflux diet given Begin proton pump inhibitor

## 2020-06-01 NOTE — Assessment & Plan Note (Signed)
  .   Current smoking consumption amount: Patient does not smoke tobacco products she chews tobacco products on a daily basis  . Dicsussion on advise to quit smoking and smoking impacts: Impacts on oral health and causing elevated blood pressure  . Patient's willingness to quit: Not yet willing to quit  . Methods to quit smoking discussed: Nicotine replacement  . Medication management of smoking session drugs discussed: Nicotine replacement  . Resources provided:  AVS   . Setting quit date Not established . Follow-up arranged 2 months   Time spent counseling the patient: 5 minutes

## 2020-06-01 NOTE — Assessment & Plan Note (Signed)
History of vitamin D deficiency in the past we will recheck vitamin D levels

## 2020-06-01 NOTE — Patient Instructions (Signed)
Start valsartan HCT daily for blood pressure  Start omeprazole 1 capsule in the morning before breakfast and then eat for acid  Complete set of labs will be obtained for screening  A Pap smear was performed  Follow back exercises below and reflux diet below  Return to see Dr. Delford Field in 2 months  Food Choices for Gastroesophageal Reflux Disease, Adult When you have gastroesophageal reflux disease (GERD), the foods you eat and your eating habits are very important. Choosing the right foods can help ease your discomfort. Think about working with a food expert (dietitian) to help you make good choices. What are tips for following this plan? Reading food labels  Look for foods that are low in saturated fat. Foods that may help with your symptoms include: ? Foods that have less than 5% of daily value (DV) of fat. ? Foods that have 0 grams of trans fat. Cooking  Do not fry your food.  Cook your food by baking, steaming, grilling, or broiling. These are all methods that do not need a lot of fat for cooking.  To add flavor, try to use herbs that are low in spice and acidity. Meal planning  Choose healthy foods that are low in fat, such as: ? Fruits and vegetables. ? Whole grains. ? Low-fat dairy products. ? Lean meats, fish, and poultry.  Eat small meals often instead of eating 3 large meals each day. Eat your meals slowly in a place where you are relaxed. Avoid bending over or lying down until 2-3 hours after eating.  Limit high-fat foods such as fatty meats or fried foods.  Limit your intake of fatty foods, such as oils, butter, and shortening.  Avoid the following as told by your doctor: ? Foods that cause symptoms. These may be different for different people. Keep a food diary to keep track of foods that cause symptoms. ? Alcohol. ? Drinking a lot of liquid with meals. ? Eating meals during the 2-3 hours before bed.   Lifestyle  Stay at a healthy weight. Ask your doctor  what weight is healthy for you. If you need to lose weight, work with your doctor to do so safely.  Exercise for at least 30 minutes on 5 or more days each week, or as told by your doctor.  Wear loose-fitting clothes.  Do not smoke or use any products that contain nicotine or tobacco. If you need help quitting, ask your doctor.  Sleep with the head of your bed higher than your feet. Use a wedge under the mattress or blocks under the bed frame to raise the head of the bed.  Chew sugar-free gum after meals. What foods should eat? Eat a healthy, well-balanced diet of fruits, vegetables, whole grains, low-fat dairy products, lean meats, fish, and poultry. Each person is different. Foods that may cause symptoms in one person may not cause any symptoms in another person. Work with your doctor to find foods that are safe for you. The items listed above may not be a complete list of what you can eat and drink. Contact a food expert for more options.   What foods should I avoid? Limiting some of these foods may help in managing the symptoms of GERD. Everyone is different. Talk with a food expert or your doctor to help you find the exact foods to avoid, if any. Fruits Any fruits prepared with added fat. Any fruits that cause symptoms. For some people, this may include citrus fruits, such as oranges,  grapefruit, pineapple, and lemons. Vegetables Deep-fried vegetables. [place] fries. Any vegetables prepared with added fat. Any vegetables that cause symptoms. For some people, this may include tomatoes and tomato products, chili peppers, onions and garlic, and horseradish. Grains Pastries or quick breads with added fat. Meats and other proteins High-fat meats, such as fatty beef or pork, hot dogs, ribs, ham, sausage, salami, and bacon. Fried meat or protein, including fried fish and fried chicken. Nuts and nut butters, in large amounts. Dairy Whole milk and chocolate milk. Sour cream. Cream. Ice cream.  Cream cheese. Milkshakes. Fats and oils Butter. Margarine. Shortening. Ghee. Beverages Coffee and tea, with or without caffeine. Carbonated beverages. Sodas. Energy drinks. Fruit juice made with acidic fruits, such as orange or grapefruit. Tomato juice. Alcoholic drinks. Sweets and desserts Chocolate and cocoa. Donuts. Seasonings and condiments Pepper. Peppermint and spearmint. Added salt. Any condiments, herbs, or seasonings that cause symptoms. For some people, this may include curry, hot sauce, or vinegar-based salad dressings. The items listed above may not be a complete list of what you should not eat and drink. Contact a food expert for more options. Questions to ask your doctor Diet and lifestyle changes are often the first steps that are taken to manage symptoms of GERD. If diet and lifestyle changes do not help, talk with your doctor about taking medicines. Where to find more information  International Foundation for Gastrointestinal Disorders: aboutgerd.org Summary  When you have GERD, food and lifestyle choices are very important in easing your symptoms.  Eat small meals often instead of 3 large meals a day. Eat your meals slowly and in a place where you are relaxed.  Avoid bending over or lying down until 2-3 hours after eating.  Limit high-fat foods such as fatty meats or fried foods. This information is not intended to replace advice given to you by your health care provider. Make sure you discuss any questions you have with your health care provider. Document Revised: 10/20/2019 Document Reviewed: 10/20/2019 Elsevier Patient Education  2021 [employer] Inc.  Back Exercises These exercises help to make your trunk and back strong. They also help to keep the lower back flexible. Doing these exercises can help to prevent back pain or lessen existing pain.  If you have back pain, try to do these exercises 2-3 times each day or as told by your doctor.  As you get better, do  the exercises once each day. Repeat the exercises more often as told by your doctor.  To stop back pain from coming back, do the exercises once each day, or as told by your doctor. Exercises Single knee to chest Do these steps 3-5 times in a row for each leg: 1. Lie on your back on a firm bed or the floor with your legs stretched out. 2. Bring one knee to your chest. 3. Grab your knee or thigh with both hands and hold them it in place. 4. Pull on your knee until you feel a gentle stretch in your lower back or buttocks. 5. Keep doing the stretch for 10-30 seconds. 6. Slowly let go of your leg and straighten it. Pelvic tilt Do these steps 5-10 times in a row: 1. Lie on your back on a firm bed or the floor with your legs stretched out. 2. Bend your knees so they point up to the ceiling. Your feet should be flat on the floor. 3. Tighten your lower belly (abdomen) muscles to press your lower back against the floor. This will  make your tailbone point up to the ceiling instead of pointing down to your feet or the floor. 4. Stay in this position for 5-10 seconds while you gently tighten your muscles and breathe evenly. Cat-cow Do these steps until your lower back bends more easily: 1. Get on your hands and knees on a firm surface. Keep your hands under your shoulders, and keep your knees under your hips. You may put padding under your knees. 2. Let your head hang down toward your chest. Tighten (contract) the muscles in your belly. Point your tailbone toward the floor so your lower back becomes rounded like the back of a cat. 3. Stay in this position for 5 seconds. 4. Slowly lift your head. Let the muscles of your belly relax. Point your tailbone up toward the ceiling so your back forms a sagging arch like the back of a cow. 5. Stay in this position for 5 seconds.   Press-ups Do these steps 5-10 times in a row: 1. Lie on your belly (face-down) on the floor. 2. Place your hands near your head,  about shoulder-width apart. 3. While you keep your back relaxed and keep your hips on the floor, slowly straighten your arms to raise the top half of your body and lift your shoulders. Do not use your back muscles. You may change where you place your hands in order to make yourself more comfortable. 4. Stay in this position for 5 seconds. 5. Slowly return to lying flat on the floor.   Bridges Do these steps 10 times in a row: 1. Lie on your back on a firm surface. 2. Bend your knees so they point up to the ceiling. Your feet should be flat on the floor. Your arms should be flat at your sides, next to your body. 3. Tighten your butt muscles and lift your butt off the floor until your waist is almost as high as your knees. If you do not feel the muscles working in your butt and the back of your thighs, slide your feet 1-2 inches farther away from your butt. 4. Stay in this position for 3-5 seconds. 5. Slowly lower your butt to the floor, and let your butt muscles relax. If this exercise is too easy, try doing it with your arms crossed over your chest.   Belly crunches Do these steps 5-10 times in a row: 1. Lie on your back on a firm bed or the floor with your legs stretched out. 2. Bend your knees so they point up to the ceiling. Your feet should be flat on the floor. 3. Cross your arms over your chest. 4. Tip your chin a little bit toward your chest but do not bend your neck. 5. Tighten your belly muscles and slowly raise your chest just enough to lift your shoulder blades a tiny bit off of the floor. Avoid raising your body higher than that, because it can put too much stress on your low back. 6. Slowly lower your chest and your head to the floor. Back lifts Do these steps 5-10 times in a row: 1. Lie on your belly (face-down) with your arms at your sides, and rest your forehead on the floor. 2. Tighten the muscles in your legs and your butt. 3. Slowly lift your chest off of the floor while  you keep your hips on the floor. Keep the back of your head in line with the curve in your back. Look at the floor while you do this. 4.  Stay in this position for 3-5 seconds. 5. Slowly lower your chest and your face to the floor. Contact a doctor if:  Your back pain gets a lot worse when you do an exercise.  Your back pain does not get better 2 hours after you exercise. If you have any of these problems, stop doing the exercises. Do not do them again unless your doctor says it is okay. Get help right away if:  You have sudden, very bad back pain. If this happens, stop doing the exercises. Do not do them again unless your doctor says it is okay. This information is not intended to replace advice given to you by your health care provider. Make sure you discuss any questions you have with your health care provider. Document Revised: 01/03/2018 Document Reviewed: 01/03/2018 Elsevier Patient Education  2021 [employer].

## 2020-06-01 NOTE — Assessment & Plan Note (Signed)
Primary hypertension  Begin valsartan low-dose with HCT 1 daily  Check metabolic panel

## 2020-06-01 NOTE — Progress Notes (Signed)
Subjective:    Patient ID: Cindy Stewart, female    DOB: [birth year], 35 y.o.   MRN: [id number]  35 y.o.F here to est PCP   Former pt in this clinic   06/01/2020 This is a 35 year old female from [place] who arrived the [place] in 2011 with her husband.  She was in this practice till 2018 then moved to [place] and now has moved back to the area.  She has a 53-year-old daughter and a 86-month-old son who she had when she was in [place].  She is due a Pap smear as this was not performed peripartum.  Patient comes in today with complaints of upper back discomfort headaches and dizziness on arrival blood pressure is elevated at 156/96  Patient does not have prior diagnoses of hypertension however there are elevated blood pressure numbers seen in prior visits in 2018 it was just not treated  The patient does also have some occasional reflux symptoms and heartburn.  She is not on any medications at this time.  She is been screened for diabetes in the past and needs to be rescreened as well.  The patient has not received Covid vaccinations and had declined to such  Patient does not drink alcohol but endorses use of chewing tobacco to excess  Past Medical History:  Diagnosis Date  . Hypotension   . Lupus (HCC)      History reviewed. No pertinent family history.   Social History   Socioeconomic History  . Marital status: Married    Spouse name: Not on file  . Number of children: Not on file  . Years of education: Not on file  . Highest education level: Not on file  Occupational History  . Not on file  Tobacco Use  . Smoking status: Never Smoker  . Smokeless tobacco: Never Used  Substance and Sexual Activity  . Alcohol use: No  . Drug use: No  . Sexual activity: Not on file  Other Topics Concern  . Not on file  Social History Narrative  . Not on file   Social Determinants of Health   Financial Resource Strain: Not on file  Food Insecurity: Not on file  Transportation Needs: Not  on file  Physical Activity: Not on file  Stress: Not on file  Social Connections: Not on file  Intimate Partner Violence: Not on file     No Known Allergies   Outpatient Medications Prior to Visit  Medication Sig Dispense Refill  . aspirin-acetaminophen-caffeine (EXCEDRIN MIGRAINE) 250-250-65 MG tablet TAKE 1 TABLET EVERY 12 HOURS AS NEEDED. 30 tablet 0  . famotidine (PEPCID) 20 MG tablet Take 1 tablet (20 mg total) by mouth 2 (two) times daily. (Patient not taking: Reported on 10/19/2016) 60 tablet 1  . fluticasone (FLONASE) 50 MCG/ACT nasal spray Place 2 sprays into both nostrils daily. 16 g 6  . guaiFENesin-dextromethorphan (ROBITUSSIN DM) 100-10 MG/5ML syrup Take 5 mLs by mouth every 4 (four) hours as needed for cough. 236 mL 0  . ibuprofen (ADVIL,MOTRIN) 600 MG tablet TAKE 1 TABLET EVERY 8 HOURS AS NEEDED WITH FOOD. 30 tablet 0  . metroNIDAZOLE (FLAGYL) 500 MG tablet Take 1 tablet (500 mg total) by mouth 2 (two) times daily. 14 tablet 0  . ranitidine (ZANTAC) 150 MG capsule Take 1 capsule (150 mg total) by mouth 2 (two) times daily. 30 capsule 1  . Vitamin D, Ergocalciferol, (DRISDOL) 50000 units CAPS capsule Take 1 capsule (50,000 Units total) by mouth every 7 (seven) days. (  Patient not taking: Reported on 07/17/2016) 15 capsule 0   No facility-administered medications prior to visit.      Review of Systems  HENT: Positive for trouble swallowing.   Eyes: Negative.   Cardiovascular: Positive for chest pain.  Gastrointestinal: Positive for abdominal distention and abdominal pain.  Genitourinary: Positive for dysuria.  Musculoskeletal: Positive for back pain and neck pain.  Skin: Negative.   Neurological: Positive for dizziness, light-headedness and headaches.  Psychiatric/Behavioral: The patient is nervous/anxious and is hyperactive.        Objective:   Physical Exam Genitourinary:    General: Normal vulva.     Exam position: Lithotomy position.     Pubic Area: No rash.       Tanner stage (genital): 5.     Labia:        Right: No tenderness or lesion.        Left: No tenderness or lesion.      Urethra: No urethral lesion.          Comments: Pap smear done. Bartholin's gland: bilaterally present, free of inflammation, edema, pain, and tenderness. Urethra: patent, free of erythema. Perineum: intact, free of lacerations and edema. Introitus: free of inflammation, edema, ulceration, and discharge. Hymen: free of abrasions and trauma.    Vitals:   06/01/20 1411 06/01/20 1429  BP: (!) 168/104 (!) 156/96  Pulse: 96   Resp: 16   Temp: 98.5 F (36.9 C)   SpO2: 100%   Weight: 100 lb (45.4 kg)     Gen: Pleasant, well-nourished, in no distress,  normal affect  ENT: No lesions,  mouth clear,  oropharynx clear, no postnasal drip, nicotine stained teeth seen from chewing tobacco  Neck: No JVD, no TMG, no carotid bruits  Lungs: No use of accessory muscles, no dullness to percussion, clear without rales or rhonchi  Cardiovascular: RRR, heart sounds normal, no murmur or gallops, no peripheral edema  Abdomen: soft and NT, no HSM,  BS normal  Musculoskeletal: No deformities, no cyanosis or clubbing  Neuro: alert, non focal  Skin: Warm, no lesions or rashes         Assessment & Plan:  I personally reviewed all images and lab data in the [facility] system as well as any outside material available during this office visit and agree with the  radiology impressions.   Primary hypertension Primary hypertension  Begin valsartan low-dose with HCT 1 daily  Check metabolic panel  Diabetes mellitus screening General health screen by checking A1c lipid panel blood counts hepatitis C  Vitamin D deficiency History of vitamin D deficiency in the past we will recheck vitamin D levels  GERD (gastroesophageal reflux disease) Reflux symptoms Reflux diet given Begin proton pump inhibitor  Cervical cancer screening Cervical cancer screening done with Pap smear  also check and screen for STD  Tobacco use    . Current smoking consumption amount: Patient does not smoke tobacco products she chews tobacco products on a daily basis  . Dicsussion on advise to quit smoking and smoking impacts: Impacts on oral health and causing elevated blood pressure  . Patient's willingness to quit: Not yet willing to quit  . Methods to quit smoking discussed: Nicotine replacement  . Medication management of smoking session drugs discussed: Nicotine replacement  . Resources provided:  AVS   . Setting quit date Not established . Follow-up arranged 2 months   Time spent counseling the patient: 5 minutes    Diagnoses and all orders for this  visit:  Primary hypertension -     CBC with Differential/Platelet  Cervical cancer screening -     Cytology - PAP([facility]) -     Cervicovaginal ancillary only  Screen for STD (sexually transmitted disease)  Diabetes mellitus screening -     Hemoglobin A1c -     Comprehensive metabolic panel -     Lipid panel  Need for hepatitis C screening test -     HCV Ab w Reflex to Quant PCR  Vitamin D deficiency -     VITAMIN D 25 Hydroxy (Vit-D Deficiency, Fractures)  Gastroesophageal reflux disease without esophagitis  Tobacco use  Other orders -     valsartan-hydrochlorothiazide (DIOVAN HCT) 80-12.5 MG tablet; Take 1 tablet by mouth daily. -     omeprazole (PRILOSEC) 20 MG capsule; Take 1 capsule (20 mg total) by mouth daily.

## 2020-06-01 NOTE — Assessment & Plan Note (Signed)
General health screen by checking A1c lipid panel blood counts hepatitis C

## 2020-06-02 ENCOUNTER — Other Ambulatory Visit: Payer: Self-pay | Admitting: Critical Care Medicine

## 2020-06-02 LAB — VITAMIN D 25 HYDROXY (VIT D DEFICIENCY, FRACTURES): Vit D, 25-Hydroxy: 6.2 ng/mL — ABNORMAL LOW (ref 30.0–100.0)

## 2020-06-02 LAB — CBC WITH DIFFERENTIAL/PLATELET
Basophils Absolute: 0 10*3/uL (ref 0.0–0.2)
Basos: 1 %
EOS (ABSOLUTE): 0.2 10*3/uL (ref 0.0–0.4)
Eos: 2 %
Hematocrit: 39 % (ref 34.0–46.6)
Hemoglobin: 13.2 g/dL (ref 11.1–15.9)
Immature Grans (Abs): 0 10*3/uL (ref 0.0–0.1)
Immature Granulocytes: 0 %
Lymphocytes Absolute: 2.3 10*3/uL (ref 0.7–3.1)
Lymphs: 36 %
MCH: 29.1 pg (ref 26.6–33.0)
MCHC: 33.8 g/dL (ref 31.5–35.7)
MCV: 86 fL (ref 79–97)
Monocytes Absolute: 0.5 10*3/uL (ref 0.1–0.9)
Monocytes: 8 %
Neutrophils Absolute: 3.4 10*3/uL (ref 1.4–7.0)
Neutrophils: 53 %
Platelets: 155 10*3/uL (ref 150–450)
RBC: 4.54 x10E6/uL (ref 3.77–5.28)
RDW: 12.3 % (ref 11.7–15.4)
WBC: 6.4 10*3/uL (ref 3.4–10.8)

## 2020-06-02 LAB — CERVICOVAGINAL ANCILLARY ONLY
Bacterial Vaginitis (gardnerella): NEGATIVE
Candida Glabrata: NEGATIVE
Candida Vaginitis: NEGATIVE
Chlamydia: NEGATIVE
Comment: NEGATIVE
Comment: NEGATIVE
Comment: NEGATIVE
Comment: NEGATIVE
Comment: NEGATIVE
Comment: NORMAL
Neisseria Gonorrhea: NEGATIVE
Trichomonas: NEGATIVE

## 2020-06-02 LAB — LIPID PANEL
Chol/HDL Ratio: 1.7 ratio (ref 0.0–4.4)
Cholesterol, Total: 191 mg/dL (ref 100–199)
HDL: 115 mg/dL (ref >39)
LDL Chol Calc (NIH): 63 mg/dL (ref 0–99)
Triglycerides: 71 mg/dL (ref 0–149)
VLDL Cholesterol Cal: 13 mg/dL (ref 5–40)

## 2020-06-02 LAB — COMPREHENSIVE METABOLIC PANEL
ALT: 11 IU/L (ref 0–32)
AST: 21 IU/L (ref 0–40)
Albumin/Globulin Ratio: 1.5 (ref 1.2–2.2)
Albumin: 4.8 g/dL (ref 3.8–4.8)
Alkaline Phosphatase: 81 IU/L (ref 44–121)
BUN/Creatinine Ratio: 9 (ref 9–23)
BUN: 6 mg/dL (ref 6–20)
Bilirubin Total: 0.4 mg/dL (ref 0.0–1.2)
CO2: 22 mmol/L (ref 20–29)
Calcium: 9.7 mg/dL (ref 8.7–10.2)
Chloride: 104 mmol/L (ref 96–106)
Creatinine, Ser: 0.64 mg/dL (ref 0.57–1.00)
GFR calc Af Amer: 132 mL/min/{1.73_m2} (ref >59)
GFR calc non Af Amer: 114 mL/min/{1.73_m2} (ref >59)
Globulin, Total: 3.2 g/dL (ref 1.5–4.5)
Glucose: 117 mg/dL — ABNORMAL HIGH (ref 65–99)
Potassium: 3.4 mmol/L — ABNORMAL LOW (ref 3.5–5.2)
Sodium: 141 mmol/L (ref 134–144)
Total Protein: 8 g/dL (ref 6.0–8.5)

## 2020-06-02 LAB — HCV INTERPRETATION

## 2020-06-02 LAB — HEMOGLOBIN A1C
Est. average glucose Bld gHb Est-mCnc: 105 mg/dL
Hgb A1c MFr Bld: 5.3 % (ref 4.8–5.6)

## 2020-06-02 LAB — HCV AB W REFLEX TO QUANT PCR: HCV Ab: 0.1 s/co ratio (ref 0.0–0.9)

## 2020-06-02 MED ORDER — VITAMIN D (ERGOCALCIFEROL) 1.25 MG (50000 UNIT) PO CAPS: 50000.0000 [IU] | ORAL_CAPSULE | ORAL | 5 refills | Status: DC

## 2020-06-02 MED FILL — VIT D2 1.25 MG (50,000 UNIT: 1.25 MG | 28 days supply | Qty: 4 | Fill #0

## 2020-06-04 LAB — CYTOLOGY - PAP
Comment: NEGATIVE
Diagnosis: NEGATIVE
High risk HPV: NEGATIVE

## 2020-06-11 ENCOUNTER — Encounter: Payer: Self-pay | Admitting: *Deleted

## 2020-07-29 ENCOUNTER — Other Ambulatory Visit: Payer: Self-pay

## 2020-07-29 ENCOUNTER — Encounter: Payer: Self-pay | Admitting: Critical Care Medicine

## 2020-07-29 ENCOUNTER — Ambulatory Visit: Payer: Medicaid Other | Attending: Critical Care Medicine | Admitting: Critical Care Medicine

## 2020-07-29 DIAGNOSIS — Z3009 Encounter for other general counseling and advice on contraception: Secondary | ICD-10-CM

## 2020-07-29 DIAGNOSIS — E559 Vitamin D deficiency, unspecified: Secondary | ICD-10-CM | POA: Diagnosis not present

## 2020-07-29 DIAGNOSIS — I10 Essential (primary) hypertension: Secondary | ICD-10-CM

## 2020-07-29 DIAGNOSIS — K219 Gastro-esophageal reflux disease without esophagitis: Secondary | ICD-10-CM

## 2020-07-29 DIAGNOSIS — Z124 Encounter for screening for malignant neoplasm of cervix: Secondary | ICD-10-CM

## 2020-07-29 NOTE — Progress Notes (Signed)
Subjective:    Patient ID: Cindy Stewart, female    DOB: [birth year], 35 y.o.   MRN: [id number] Virtual Visit via Telephone Note  I connected with Cindy Stewart on 07/29/20 at 10:30 AM EDT by telephone and verified that I am speaking with the correct person using two identifiers.   Consent:  I discussed the limitations, risks, security and privacy concerns of performing an evaluation and management service by telephone and the availability of in person appointments. I also discussed with the patient that there may be a patient responsible charge related to this service. The patient expressed understanding and agreed to proceed.  Location of patient: Patient's at home  Location of provider: I am in the office  Persons participating in the televisit with the patient.   [place] Nepali interpreter [phone] was on the phone giving language barrier interpretations Patient's husband was also on the call   History of Present Illness:  35 y.o.F here to est PCP   Former pt in this clinic   06/01/2020 This is a 35 year old female from [place] who arrived the [place] in 2011 with her husband.  She was in this practice till 2018 then moved to [place] and now has moved back to the area.  She has a 2-year-old daughter and a 56-month-old son who she had when she was in [place].  She is due a Pap smear as this was not performed peripartum.  Patient comes in today with complaints of upper back discomfort headaches and dizziness on arrival blood pressure is elevated at 156/96  Patient does not have prior diagnoses of hypertension however there are elevated blood pressure numbers seen in prior visits in 2018 it was just not treated  The patient does also have some occasional reflux symptoms and heartburn.  She is not on any medications at this time.  She is been screened for diabetes in the past and needs to be rescreened as well.  The patient has not received Covid vaccinations and had declined to  such  Patient does not drink alcohol but endorses use of chewing tobacco to excess   07/29/2020 This is a telephone visit follow-up for this patient she did not have the technology for video.  This patient was seen last in February for hypertension and reflux disease.  In the interim she is doing better her reflux symptoms have resolved she does not monitor her blood pressure at home.  The patient's been compliant with valsartan HCT without difficulty.  Her diabetic screening labs were negative at the last visit.  She had did have a Pap smear at the last visit it was negative for cancer or precancerous changes.  Hepatitis C HIV studies were negative the last visit.  She does have significant vitamin D deficiency she is not been taking her vitamin D medication.  Past Medical History:  Diagnosis Date  . Hypotension   . Lupus (HCC)      History reviewed. No pertinent family history.   Social History   Socioeconomic History  . Marital status: Married    Spouse name: Not on file  . Number of children: Not on file  . Years of education: Not on file  . Highest education level: Not on file  Occupational History  . Not on file  Tobacco Use  . Smoking status: Never Smoker  . Smokeless tobacco: Never Used  Substance and Sexual Activity  . Alcohol use: No  . Drug use: No  . Sexual activity: Not on file  Other Topics Concern  . Not on file  Social History Narrative  . Not on file   Social Determinants of Health   Financial Resource Strain: Not on file  Food Insecurity: Not on file  Transportation Needs: Not on file  Physical Activity: Not on file  Stress: Not on file  Social Connections: Not on file  Intimate Partner Violence: Not on file     No Known Allergies   Outpatient Medications Prior to Visit  Medication Sig Dispense Refill  . omeprazole (PRILOSEC) 20 MG capsule Take 1 capsule (20 mg total) by mouth daily. 30 capsule 4  . omeprazole (PRILOSEC) 20 MG capsule TAKE 1  CAPSULE (20 MG TOTAL) BY MOUTH DAILY. 30 capsule 4  . valsartan-hydrochlorothiazide (DIOVAN HCT) 80-12.5 MG tablet Take 1 tablet by mouth daily. 60 tablet 2  . valsartan-hydrochlorothiazide (DIOVAN-HCT) 80-12.5 MG tablet TAKE 1 TABLET BY MOUTH DAILY. 60 tablet 2  . Vitamin D, Ergocalciferol, (DRISDOL) 1.25 MG (50000 UNIT) CAPS capsule Take 1 capsule (50,000 Units total) by mouth every 7 (seven) days. 12 capsule 5  . Vitamin D, Ergocalciferol, (DRISDOL) 1.25 MG (50000 UNIT) CAPS capsule TAKE 1 CAPSULE (50,000 UNITS TOTAL) BY MOUTH EVERY 7 (SEVEN) DAYS. 12 capsule 5   No facility-administered medications prior to visit.      Review of Systems  HENT: Negative for trouble swallowing.   Eyes: Negative.   Cardiovascular: Negative for chest pain.  Gastrointestinal: Negative for abdominal distention and abdominal pain.  Genitourinary: Negative for dysuria.  Musculoskeletal: Positive for back pain and neck pain.  Skin: Negative.   Neurological: Positive for light-headedness and headaches. Negative for dizziness.  Psychiatric/Behavioral: The patient is nervous/anxious and is hyperactive.        Objective:    There were no vitals filed for this visit. No exam this was a phone note       Assessment & Plan:  I personally reviewed all images and lab data in the [facility] system as well as any outside material available during this office visit and agree with the  radiology impressions.   Primary hypertension We will bring the patient back for short-term follow-up to check her blood pressure she will maintain valsartan HCT for now  GERD (gastroesophageal reflux disease) Reflux is better on proton pump inhibitor maintain same  Vitamin D deficiency Refills on vitamin D sent to our pharmacy patient knows to pick this up take it weekly  Cervical cancer screening Cervical cancer screening was negative recheck in 3 years   Diagnoses and all orders for this visit:  Birth control counseling -      Ambulatory referral to Gynecology  Primary hypertension  Gastroesophageal reflux disease without esophagitis  Vitamin D deficiency  Cervical cancer screening   18 minutes time spent nonface-to-face talking the patient on the phone reviewing records formulating a plan writing prescriptions documenting note this required moderate medical decision making Follow Up Instructions:    I discussed the assessment and treatment plan with the patient. The patient was provided an opportunity to ask questions and all were answered. The patient agreed with the plan and demonstrated an understanding of the instructions.   The patient was advised to call back or seek an in-person evaluation if the symptoms worsen or if the condition fails to improve as anticipated.  I provided 18 minutes of non-face-to-face time during this encounter  including  median intraservice time , review of notes, labs, imaging, medications  and explaining diagnosis and management to the patient .  Asencion Noble, MD

## 2020-07-29 NOTE — Assessment & Plan Note (Signed)
Refills on vitamin D sent to our pharmacy patient knows to pick this up take it weekly

## 2020-07-29 NOTE — Assessment & Plan Note (Signed)
Cervical cancer screening was negative recheck in 3 years

## 2020-07-29 NOTE — Assessment & Plan Note (Signed)
Reflux is better on proton pump inhibitor maintain same

## 2020-07-29 NOTE — Assessment & Plan Note (Signed)
We will bring the patient back for short-term follow-up to check her blood pressure she will maintain valsartan HCT for now

## 2020-08-13 ENCOUNTER — Other Ambulatory Visit: Payer: Self-pay

## 2020-08-13 ENCOUNTER — Emergency Department (HOSPITAL_COMMUNITY)
Admission: EM | Admit: 2020-08-13 | Discharge: 2020-08-13 | Discharge status: Against Medical Advice | Payer: Medicaid Other | Attending: Emergency Medicine | Admitting: Emergency Medicine

## 2020-08-16 ENCOUNTER — Other Ambulatory Visit: Payer: Self-pay

## 2020-08-16 MED FILL — Valsartan-Hydrochlorothiazide Tab 80-12.5 MG: ORAL | 60 days supply | Qty: 60 | Fill #0 | Status: AC

## 2020-08-17 ENCOUNTER — Telehealth: Payer: Self-pay | Admitting: Critical Care Medicine

## 2020-08-17 NOTE — Telephone Encounter (Signed)
Called Pt with interpreter services. Pt needed follow up appt with Dr. Delford Field for May on a Tues or Wednesday. Pt did not answer vm was left for Pt to call [phone] to schedule soonest appt with Dr. Delford Field.

## 2020-08-17 NOTE — Telephone Encounter (Signed)
-----   Message from Storm Frisk, MD sent at 07/29/2020 10:55 AM EDT ----- Needs face to face visit with me in early may

## 2020-08-18 ENCOUNTER — Other Ambulatory Visit: Payer: Self-pay

## 2020-08-18 MED FILL — Ergocalciferol Cap 1.25 MG (50000 Unit): ORAL | 84 days supply | Qty: 12 | Fill #0 | Status: AC

## 2020-09-02 ENCOUNTER — Telehealth: Payer: Self-pay | Admitting: Critical Care Medicine

## 2020-09-02 NOTE — Telephone Encounter (Signed)
Let pt know she missed her GYN appt on 5/11   She needs to call [phone] to reschedule [facility]

## 2020-10-27 ENCOUNTER — Encounter: Payer: Self-pay | Admitting: Obstetrics and Gynecology

## 2020-10-27 ENCOUNTER — Ambulatory Visit (INDEPENDENT_AMBULATORY_CARE_PROVIDER_SITE_OTHER): Payer: Medicaid Other | Admitting: Obstetrics and Gynecology

## 2020-10-27 ENCOUNTER — Other Ambulatory Visit: Payer: Self-pay

## 2020-10-27 VITALS — BP 135/82 | HR 99 | Temp 98.4°F | Ht 59.0 in | Wt 97.0 lb

## 2020-10-27 DIAGNOSIS — Z30017 Encounter for initial prescription of implantable subdermal contraceptive: Secondary | ICD-10-CM | POA: Diagnosis not present

## 2020-10-27 DIAGNOSIS — Z3201 Encounter for pregnancy test, result positive: Secondary | ICD-10-CM | POA: Diagnosis not present

## 2020-10-27 DIAGNOSIS — Z789 Other specified health status: Secondary | ICD-10-CM

## 2020-10-27 DIAGNOSIS — Z3490 Encounter for supervision of normal pregnancy, unspecified, unspecified trimester: Secondary | ICD-10-CM

## 2020-10-27 DIAGNOSIS — Z603 Acculturation difficulty: Secondary | ICD-10-CM

## 2020-10-27 LAB — POCT URINE PREGNANCY: Preg Test, Ur: POSITIVE — AB

## 2020-10-27 NOTE — Progress Notes (Signed)
  GYNECOLOGY PROGRESS NOTE  History:  [name] is a 35 y.o. G2P2002 presents to [facility] office today for insertion of Nexplanon. She reports LMP was 07/29/2020; none since. She denies h/a, dizziness, shortness of breath, n/v, or fever/chills.    The following portions of the patient's history were reviewed and updated as appropriate: allergies, current medications, past family history, past medical history, past social history, past surgical history and problem list. Last pap smear on 06/01/2020 was normal, negative HRHPV.  Review of Systems:  Pertinent items are noted in HPI.   Objective:  Physical Exam Blood pressure 135/82, pulse 99, temperature 98.4 F (36.9 C), temperature source Oral, height 4\' 11"  (1.499 m), weight 97 lb (44 kg), not currently breastfeeding. VS reviewed, nursing note reviewed,  Constitutional: well developed, well nourished, no distress HEENT: normocephalic CV: normal rate Pulm/chest wall: normal effort Breast Exam: deferred Abdomen: soft Neuro: alert and oriented x 3 Skin: warm, dry Psych: affect normal Pelvic exam: deferred  Assessment & Plan:  1. Unplanned pregnancy, unknown if wanted - Patient and spouse express extreme concern over dx of pregnancy and wants to know what they can do. - Discussed options of starting [facility] or termination of pregnancy - Advised that if termination was the option they chose, they would have to do their own research to find facilities for termination of pregnancy - Explained terminations are not performed at this facility - If termination is the outcome of this pregnancy, please allow 1 month of normal menstrual cycle and protected SI before scheduling Nexplanon insertion - Patient verbalized an understanding of the plan of care and agrees.   2. Insertion of Nexplanon - POCT urine pregnancy  3. Language barrier affecting health care  - [employer] Video [place] used for  entire visit    #[id number], CNM 10:00 AM

## 2020-10-28 ENCOUNTER — Encounter: Payer: Self-pay | Admitting: Obstetrics and Gynecology

## 2020-11-30 ENCOUNTER — Ambulatory Visit: Payer: Medicaid Other

## 2020-11-30 ENCOUNTER — Other Ambulatory Visit: Payer: Self-pay

## 2021-01-25 ENCOUNTER — Ambulatory Visit (INDEPENDENT_AMBULATORY_CARE_PROVIDER_SITE_OTHER): Payer: Medicaid Other | Admitting: Advanced Practice Midwife

## 2021-01-25 ENCOUNTER — Other Ambulatory Visit: Payer: Self-pay

## 2021-01-25 VITALS — BP 118/82 | HR 89 | Wt 98.7 lb

## 2021-01-25 DIAGNOSIS — Z23 Encounter for immunization: Secondary | ICD-10-CM

## 2021-01-25 DIAGNOSIS — O0990 Supervision of high risk pregnancy, unspecified, unspecified trimester: Secondary | ICD-10-CM

## 2021-01-25 DIAGNOSIS — Z3A21 21 weeks gestation of pregnancy: Secondary | ICD-10-CM

## 2021-01-25 DIAGNOSIS — O10919 Unspecified pre-existing hypertension complicating pregnancy, unspecified trimester: Secondary | ICD-10-CM | POA: Insufficient documentation

## 2021-01-25 DIAGNOSIS — K219 Gastro-esophageal reflux disease without esophagitis: Secondary | ICD-10-CM

## 2021-01-25 DIAGNOSIS — Z5941 Food insecurity: Secondary | ICD-10-CM

## 2021-01-25 DIAGNOSIS — Z720 Tobacco use: Secondary | ICD-10-CM

## 2021-01-25 MED ORDER — ASPIRIN EC 81 MG PO TBEC
81.0000 mg | DELAYED_RELEASE_TABLET | Freq: Every day | ORAL | 5 refills | Status: DC
Start: 2021-01-25 — End: 2021-04-30

## 2021-01-25 MED ORDER — BLOOD PRESSURE KIT DEVI: 1.0000 | 0 refills | Status: DC

## 2021-01-25 NOTE — Progress Notes (Signed)
Patient reports pelvic and lower back pain

## 2021-01-25 NOTE — Progress Notes (Signed)
Subjective:   Cindy Stewart is a 35 y.o. G3P2002 at 1w3dby LMP being seen today for her first obstetrical visit.  Her obstetrical history is significant for advanced maternal age, smoker, and NSVD x 2, ages 89and 113 months and has Cervical cancer screening; Primary hypertension; Vitamin D deficiency; GERD (gastroesophageal reflux disease); Tobacco use; Unplanned pregnancy, unknown if wanted; Supervision of high risk pregnancy, antepartum; and Chronic hypertension affecting pregnancy on their problem list.. Patient does intend to breast feed. Pregnancy history fully reviewed.  Patient reports  occasional back and abdominal cramping .  HISTORY: OB History  Gravida Para Term Preterm AB Living  _0 0 0 2  SAB IAB Ectopic Multiple Live Births  0 0 0 0 2    # Outcome Date GA Lbr Len/2nd Weight Sex Delivery Anes PTL Lv  3 Current           2 Term 08/29/19 389w0d5 lb 8 oz (2.495 kg) M Vag-Spont  N LIV     Complications: PIH (pregnancy induced hypertension), Gestational diabetes  1 Term 10/10/13 3947w0d lb 12 oz (2.155 kg) F Vag-Spont  N LIV   Past Medical History:  Diagnosis Date   Hypertension    Hypotension    Lupus ([facility]  No past surgical history on file. No family history on file. Social History   Tobacco Use   Smoking status: Never   Smokeless tobacco: Never  Vaping Use   Vaping Use: Never used  Substance Use Topics   Alcohol use: No   Drug use: No   No Known Allergies Current Outpatient Medications on File Prior to Visit  Medication Sig Dispense Refill   omeprazole (PRILOSEC) 20 MG capsule Take 1 capsule (20 mg total) by mouth daily. (Patient not taking: Reported on 01/25/2021) 30 capsule 4   valsartan-hydrochlorothiazide (DIOVAN HCT) 80-12.5 MG tablet Take 1 tablet by mouth daily. (Patient not taking: Reported on 01/25/2021) 60 tablet 2   Vitamin D, Ergocalciferol, (DRISDOL) 1.25 MG (50000 UNIT) CAPS capsule Take 1 capsule (50,000 Units total) by mouth every  7 (seven) days. (Patient not taking: Reported on 01/25/2021) 12 capsule 5   No current facility-administered medications on file prior to visit.     Indications for ASA therapy (per uptodate) One of the following: Previous pregnancy with preeclampsia, especially early onset and with an adverse outcome No Multifetal gestation No Chronic hypertension Yes Type 1 or 2 diabetes mellitus No Chronic kidney disease No Autoimmune disease (antiphospholipid syndrome, systemic lupus erythematosus) No   Indications for early 1 hour GTT (per uptodate)  BMI >25 (>23 in Asian women) AND one of the following  No, BMI 19  Exam   Vitals:   01/25/21 1002  BP: 118/82  Pulse: 89  Weight: 98 lb 11.2 oz (44.8 kg)   Fetal Heart Rate (bpm): 153  Uterus:     Pelvic Exam: Perineum: no hemorrhoids, normal perineum   Vulva: normal external genitalia, no lesions   Vagina:  normal mucosa, normal discharge   Cervix: no lesions and normal, pap smear done.    Adnexa: normal adnexa and no mass, fullness, tenderness   Bony Pelvis: average  System: General: well-developed, well-nourished female in no acute distress   Breast:  normal appearance, no masses or tenderness   Skin: normal coloration and turgor, no rashes   Neurologic: oriented, normal, negative, normal mood   Extremities: normal strength, tone, and muscle mass, ROM of  all joints is normal   HEENT PERRLA, extraocular movement intact and sclera clear, anicteric   Mouth/Teeth mucous membranes moist, pharynx normal without lesions and dental hygiene good   Neck supple and no masses   Cardiovascular: regular rate and rhythm   Respiratory:  no respiratory distress, normal breath sounds   Abdomen: soft, non-tender; bowel sounds normal; no masses,  no organomegaly     Assessment:   Pregnancy: [id number] Patient Active Problem List   Diagnosis Date Noted   Supervision of high risk pregnancy, antepartum 01/25/2021   Chronic hypertension affecting  pregnancy 01/25/2021   Unplanned pregnancy, unknown if wanted 10/27/2020   Cervical cancer screening 06/01/2020   Primary hypertension 06/01/2020   Vitamin D deficiency 06/01/2020   GERD (gastroesophageal reflux disease) 06/01/2020   Tobacco use 06/01/2020     Plan:  1. Supervision of high risk pregnancy, antepartum --Anticipatory guidance about next visits/weeks of pregnancy given. --[facility]20 [facility] --Overall doing well, surprise pregnancy, desired Nexplanon but test was positive at her contraceptive visit.  Desires Nexplanon postpartum. --Next visit in 4 weeks  - CBC/D/Plt+RPR+Rh+ABO+RubIgG... - Hemoglobin A1c - Culture, OB Urine - Genetic Screening - US MFM OB DETAIL +14 WK; Future - AMBULATORY REFERRAL TO [facility] - Comp Met (CMET) - Protein / creatinine ratio, urine - AFP, Serum, Open Spina Bifida  2. Gastroesophageal reflux disease, unspecified whether esophagitis present --No s/sx currently  3. Tobacco use    4. Chronic hypertension affecting pregnancy --Baseline labs today --Pt was prescribed meds prior to pregnancy but never started due to pregnancy --BP wnl today, continue to watch --BASA to start today - Comp Met (CMET) - Protein / creatinine ratio, urine  5. [redacted] weeks gestation of pregnancy   6. Food insecurity  - AMBULATORY REFERRAL TO [place]    Initial labs drawn. Continue prenatal vitamins. Discussed and offered genetic screening options, including Quad screen/AFP, NIPS testing, and option to decline testing. Benefits/risks/alternatives reviewed. Pt aware that anatomy US is form of genetic screening with lower accuracy in detecting trisomies than blood work.  Pt chooses genetic screening today. NIPS: ordered. Ultrasound discussed; fetal anatomic survey: ordered. Problem list reviewed and updated. The nature of [place] with multiple MDs and other Advanced  Practice Providers was explained to patient; also emphasized that residents, students are part of our team. Routine obstetric precautions reviewed. Return in about 4 weeks (around 02/22/2021).   Fatima Blank, CNM 01/25/21 10:58 AM

## 2021-01-26 LAB — COMPREHENSIVE METABOLIC PANEL
ALT: 9 IU/L (ref 0–32)
AST: 17 IU/L (ref 0–40)
Albumin/Globulin Ratio: 1.3 (ref 1.2–2.2)
Albumin: 3.7 g/dL — ABNORMAL LOW (ref 3.8–4.8)
Alkaline Phosphatase: 69 IU/L (ref 44–121)
BUN/Creatinine Ratio: 10 (ref 9–23)
BUN: 4 mg/dL — ABNORMAL LOW (ref 6–20)
Bilirubin Total: 0.3 mg/dL (ref 0.0–1.2)
CO2: 19 mmol/L — ABNORMAL LOW (ref 20–29)
Calcium: 8.7 mg/dL (ref 8.7–10.2)
Chloride: 103 mmol/L (ref 96–106)
Creatinine, Ser: 0.39 mg/dL — ABNORMAL LOW (ref 0.57–1.00)
Globulin, Total: 2.9 g/dL (ref 1.5–4.5)
Glucose: 81 mg/dL (ref 70–99)
Potassium: 3.6 mmol/L (ref 3.5–5.2)
Sodium: 137 mmol/L (ref 134–144)
Total Protein: 6.6 g/dL (ref 6.0–8.5)
eGFR: 131 mL/min/{1.73_m2} (ref >59)

## 2021-01-26 LAB — CBC/D/PLT+RPR+RH+ABO+RUBIGG...
Antibody Screen: NEGATIVE
Basophils Absolute: 0 10*3/uL (ref 0.0–0.2)
Basos: 0 %
EOS (ABSOLUTE): 0.3 10*3/uL (ref 0.0–0.4)
Eos: 3 %
HCV Ab: <0.1 s/co ratio (ref 0.0–0.9)
HIV Screen 4th Generation wRfx: NONREACTIVE
Hematocrit: 30.6 % — ABNORMAL LOW (ref 34.0–46.6)
Hemoglobin: 10.4 g/dL — ABNORMAL LOW (ref 11.1–15.9)
Hepatitis B Surface Ag: NEGATIVE
Immature Grans (Abs): 0.1 10*3/uL (ref 0.0–0.1)
Immature Granulocytes: 1 %
Lymphocytes Absolute: 2.1 10*3/uL (ref 0.7–3.1)
Lymphs: 19 %
MCH: 29.3 pg (ref 26.6–33.0)
MCHC: 34 g/dL (ref 31.5–35.7)
MCV: 86 fL (ref 79–97)
Monocytes Absolute: 0.6 10*3/uL (ref 0.1–0.9)
Monocytes: 5 %
Neutrophils Absolute: 8 10*3/uL — ABNORMAL HIGH (ref 1.4–7.0)
Neutrophils: 72 %
Platelets: 184 10*3/uL (ref 150–450)
RBC: 3.55 x10E6/uL — ABNORMAL LOW (ref 3.77–5.28)
RDW: 11.8 % (ref 11.7–15.4)
RPR Ser Ql: NONREACTIVE
Rh Factor: POSITIVE
Rubella Antibodies, IGG: 15.1 index (ref >0.99)
WBC: 11.1 10*3/uL — ABNORMAL HIGH (ref 3.4–10.8)

## 2021-01-26 LAB — HEMOGLOBIN A1C
Est. average glucose Bld gHb Est-mCnc: 105 mg/dL
Hgb A1c MFr Bld: 5.3 % (ref 4.8–5.6)

## 2021-01-26 LAB — PROTEIN / CREATININE RATIO, URINE
Creatinine, Urine: 9 mg/dL
Protein, Ur: <4 mg/dL

## 2021-01-26 LAB — HCV INTERPRETATION

## 2021-01-27 DIAGNOSIS — O280 Abnormal hematological finding on antenatal screening of mother: Secondary | ICD-10-CM | POA: Insufficient documentation

## 2021-01-27 LAB — AFP, SERUM, OPEN SPINA BIFIDA
AFP MoM: 2.88
AFP Value: 276.3 ng/mL
Gest. Age on Collection Date: 21.4 weeks
Maternal Age At EDD: 38.1 yr
OSBR Risk 1 IN: 144
Test Results:: POSITIVE — AB
Weight: 98 [lb_av]

## 2021-01-27 LAB — CULTURE, OB URINE

## 2021-01-27 LAB — URINE CULTURE, OB REFLEX

## 2021-02-01 ENCOUNTER — Other Ambulatory Visit: Payer: Self-pay

## 2021-02-09 ENCOUNTER — Ambulatory Visit: Payer: Medicaid Other | Admitting: *Deleted

## 2021-02-09 ENCOUNTER — Other Ambulatory Visit: Payer: Self-pay | Admitting: *Deleted

## 2021-02-09 ENCOUNTER — Ambulatory Visit: Payer: Medicaid Other

## 2021-02-09 ENCOUNTER — Other Ambulatory Visit: Payer: Self-pay

## 2021-02-09 ENCOUNTER — Encounter: Payer: Self-pay | Admitting: *Deleted

## 2021-02-09 ENCOUNTER — Telehealth: Payer: Self-pay

## 2021-02-09 ENCOUNTER — Ambulatory Visit: Payer: Medicaid Other | Attending: Advanced Practice Midwife

## 2021-02-09 VITALS — BP 137/92 | HR 85

## 2021-02-09 DIAGNOSIS — O280 Abnormal hematological finding on antenatal screening of mother: Secondary | ICD-10-CM

## 2021-02-09 DIAGNOSIS — O0990 Supervision of high risk pregnancy, unspecified, unspecified trimester: Secondary | ICD-10-CM

## 2021-02-09 DIAGNOSIS — O10912 Unspecified pre-existing hypertension complicating pregnancy, second trimester: Secondary | ICD-10-CM

## 2021-02-09 DIAGNOSIS — O0932 Supervision of pregnancy with insufficient antenatal care, second trimester: Secondary | ICD-10-CM

## 2021-02-09 NOTE — Progress Notes (Signed)
Dr. Booker aware of elevated BP's. 

## 2021-02-09 NOTE — Telephone Encounter (Addendum)
-----   Message from Hurshel Party, CNM sent at 02/09/2021  3:24 PM EDT ----- Regarding: Recalculate AFP Pt had abnormal AFP but EDD was changed (by 2 weeks) with recent ultrasound. Can we resend her lab and have the AFP recalcuated by new EDD of 05/13/21?  Thank you.   Called [facility]. New gestational age given for date of lab draw. This will be recalculated and sent via fax to office.

## 2021-02-28 ENCOUNTER — Encounter: Payer: Medicaid Other | Admitting: Nurse Practitioner

## 2021-03-03 ENCOUNTER — Telehealth: Payer: Self-pay | Admitting: Family Medicine

## 2021-03-03 ENCOUNTER — Encounter: Payer: Medicaid Other | Admitting: Family Medicine

## 2021-03-03 NOTE — Telephone Encounter (Signed)
Used interpreter line to call patients number listed under home phone, there was no answer to the phone call so a voice mail was left with a message and a phone number to call the office back on.

## 2021-03-09 ENCOUNTER — Ambulatory Visit: Payer: Medicaid Other | Attending: Maternal & Fetal Medicine

## 2021-03-09 ENCOUNTER — Encounter: Payer: Self-pay | Admitting: *Deleted

## 2021-03-09 ENCOUNTER — Ambulatory Visit: Payer: Medicaid Other | Admitting: *Deleted

## 2021-03-09 ENCOUNTER — Other Ambulatory Visit: Payer: Self-pay

## 2021-03-09 VITALS — BP 142/93 | HR 93

## 2021-03-09 DIAGNOSIS — O0933 Supervision of pregnancy with insufficient antenatal care, third trimester: Secondary | ICD-10-CM | POA: Diagnosis not present

## 2021-03-09 DIAGNOSIS — O280 Abnormal hematological finding on antenatal screening of mother: Secondary | ICD-10-CM

## 2021-03-09 DIAGNOSIS — O0932 Supervision of pregnancy with insufficient antenatal care, second trimester: Secondary | ICD-10-CM | POA: Insufficient documentation

## 2021-03-09 DIAGNOSIS — O10912 Unspecified pre-existing hypertension complicating pregnancy, second trimester: Secondary | ICD-10-CM | POA: Insufficient documentation

## 2021-03-09 DIAGNOSIS — O0990 Supervision of high risk pregnancy, unspecified, unspecified trimester: Secondary | ICD-10-CM | POA: Diagnosis present

## 2021-03-09 DIAGNOSIS — Z3A30 30 weeks gestation of pregnancy: Secondary | ICD-10-CM

## 2021-03-09 DIAGNOSIS — O10013 Pre-existing essential hypertension complicating pregnancy, third trimester: Secondary | ICD-10-CM | POA: Diagnosis not present

## 2021-03-10 ENCOUNTER — Other Ambulatory Visit: Payer: Self-pay | Admitting: *Deleted

## 2021-03-10 DIAGNOSIS — O10913 Unspecified pre-existing hypertension complicating pregnancy, third trimester: Secondary | ICD-10-CM

## 2021-03-11 ENCOUNTER — Other Ambulatory Visit: Payer: Self-pay | Admitting: *Deleted

## 2021-03-11 DIAGNOSIS — O0990 Supervision of high risk pregnancy, unspecified, unspecified trimester: Secondary | ICD-10-CM

## 2021-03-14 ENCOUNTER — Inpatient Hospital Stay (HOSPITAL_COMMUNITY)
Admission: AD | Admit: 2021-03-14 | Discharge: 2021-03-14 | Disposition: A | Discharge status: Home / Self Care | Payer: Medicaid Other | Attending: Obstetrics and Gynecology | Admitting: Obstetrics and Gynecology

## 2021-03-14 ENCOUNTER — Encounter (HOSPITAL_COMMUNITY): Payer: Self-pay | Admitting: Obstetrics and Gynecology

## 2021-03-14 ENCOUNTER — Other Ambulatory Visit: Payer: Self-pay

## 2021-03-14 ENCOUNTER — Ambulatory Visit (INDEPENDENT_AMBULATORY_CARE_PROVIDER_SITE_OTHER): Payer: Medicaid Other

## 2021-03-14 VITALS — BP 147/101 | HR 101 | Wt 104.0 lb

## 2021-03-14 DIAGNOSIS — J302 Other seasonal allergic rhinitis: Secondary | ICD-10-CM | POA: Insufficient documentation

## 2021-03-14 DIAGNOSIS — R0789 Other chest pain: Secondary | ICD-10-CM | POA: Diagnosis not present

## 2021-03-14 DIAGNOSIS — O4700 False labor before 37 completed weeks of gestation, unspecified trimester: Secondary | ICD-10-CM

## 2021-03-14 DIAGNOSIS — O10913 Unspecified pre-existing hypertension complicating pregnancy, third trimester: Secondary | ICD-10-CM | POA: Diagnosis not present

## 2021-03-14 DIAGNOSIS — O10919 Unspecified pre-existing hypertension complicating pregnancy, unspecified trimester: Secondary | ICD-10-CM

## 2021-03-14 DIAGNOSIS — E876 Hypokalemia: Secondary | ICD-10-CM | POA: Insufficient documentation

## 2021-03-14 DIAGNOSIS — O99891 Other specified diseases and conditions complicating pregnancy: Secondary | ICD-10-CM

## 2021-03-14 DIAGNOSIS — O26893 Other specified pregnancy related conditions, third trimester: Secondary | ICD-10-CM | POA: Insufficient documentation

## 2021-03-14 DIAGNOSIS — O99283 Endocrine, nutritional and metabolic diseases complicating pregnancy, third trimester: Secondary | ICD-10-CM | POA: Diagnosis not present

## 2021-03-14 DIAGNOSIS — O10013 Pre-existing essential hypertension complicating pregnancy, third trimester: Secondary | ICD-10-CM

## 2021-03-14 DIAGNOSIS — M329 Systemic lupus erythematosus, unspecified: Secondary | ICD-10-CM | POA: Diagnosis not present

## 2021-03-14 DIAGNOSIS — Z789 Other specified health status: Secondary | ICD-10-CM

## 2021-03-14 DIAGNOSIS — O09523 Supervision of elderly multigravida, third trimester: Secondary | ICD-10-CM | POA: Insufficient documentation

## 2021-03-14 DIAGNOSIS — Z3A31 31 weeks gestation of pregnancy: Secondary | ICD-10-CM | POA: Insufficient documentation

## 2021-03-14 DIAGNOSIS — O0990 Supervision of high risk pregnancy, unspecified, unspecified trimester: Secondary | ICD-10-CM

## 2021-03-14 LAB — COMPREHENSIVE METABOLIC PANEL
ALT: 15 U/L (ref 0–44)
AST: 23 U/L (ref 15–41)
Albumin: 3 g/dL — ABNORMAL LOW (ref 3.5–5.0)
Alkaline Phosphatase: 118 U/L (ref 38–126)
Anion gap: 9 (ref 5–15)
BUN: <5 mg/dL — ABNORMAL LOW (ref 6–20)
CO2: 21 mmol/L — ABNORMAL LOW (ref 22–32)
Calcium: 8.6 mg/dL — ABNORMAL LOW (ref 8.9–10.3)
Chloride: 105 mmol/L (ref 98–111)
Creatinine, Ser: 0.42 mg/dL — ABNORMAL LOW (ref 0.44–1.00)
GFR, Estimated: >60 mL/min (ref >60)
Glucose, Bld: 108 mg/dL — ABNORMAL HIGH (ref 70–99)
Potassium: 3.2 mmol/L — ABNORMAL LOW (ref 3.5–5.1)
Sodium: 135 mmol/L (ref 135–145)
Total Bilirubin: 0.5 mg/dL (ref 0.3–1.2)
Total Protein: 6.6 g/dL (ref 6.5–8.1)

## 2021-03-14 LAB — CBC WITH DIFFERENTIAL/PLATELET
Abs Immature Granulocytes: 0.15 10*3/uL — ABNORMAL HIGH (ref 0.00–0.07)
Basophils Absolute: 0.1 10*3/uL (ref 0.0–0.1)
Basophils Relative: 0 %
Eosinophils Absolute: 0.2 10*3/uL (ref 0.0–0.5)
Eosinophils Relative: 1 %
HCT: 32 % — ABNORMAL LOW (ref 36.0–46.0)
Hemoglobin: 10.5 g/dL — ABNORMAL LOW (ref 12.0–15.0)
Immature Granulocytes: 1 %
Lymphocytes Relative: 20 %
Lymphs Abs: 3.2 10*3/uL (ref 0.7–4.0)
MCH: 28.2 pg (ref 26.0–34.0)
MCHC: 32.8 g/dL (ref 30.0–36.0)
MCV: 85.8 fL (ref 80.0–100.0)
Monocytes Absolute: 1.1 10*3/uL — ABNORMAL HIGH (ref 0.1–1.0)
Monocytes Relative: 7 %
Neutro Abs: 11.4 10*3/uL — ABNORMAL HIGH (ref 1.7–7.7)
Neutrophils Relative %: 71 %
Platelets: 279 10*3/uL (ref 150–400)
RBC: 3.73 MIL/uL — ABNORMAL LOW (ref 3.87–5.11)
RDW: 12 % (ref 11.5–15.5)
WBC: 16.1 10*3/uL — ABNORMAL HIGH (ref 4.0–10.5)
nRBC: 0 % (ref 0.0–0.2)

## 2021-03-14 LAB — URINALYSIS, ROUTINE W REFLEX MICROSCOPIC
Bilirubin Urine: NEGATIVE
Glucose, UA: NEGATIVE mg/dL
Hgb urine dipstick: NEGATIVE
Ketones, ur: 20 mg/dL — AB
Nitrite: NEGATIVE
Protein, ur: NEGATIVE mg/dL
Specific Gravity, Urine: 1.013 (ref 1.005–1.030)
pH: 6 (ref 5.0–8.0)

## 2021-03-14 LAB — FETAL FIBRONECTIN: Fetal Fibronectin: NEGATIVE

## 2021-03-14 LAB — PROTEIN / CREATININE RATIO, URINE
Creatinine, Urine: 97.14 mg/dL
Protein Creatinine Ratio: 0.21 mg/mg{Cre} — ABNORMAL HIGH (ref 0.00–0.15)
Total Protein, Urine: 20 mg/dL

## 2021-03-14 LAB — TROPONIN I (HIGH SENSITIVITY)
Troponin I (High Sensitivity): 5 ng/L (ref <18)
Troponin I (High Sensitivity): 4 ng/L (ref <18)

## 2021-03-14 MED ORDER — TERBUTALINE SULFATE 1 MG/ML IJ SOLN
0.2500 mg | Freq: Once | INTRAMUSCULAR | Status: AC
  Administered 2021-03-14: 0.25 mg via SUBCUTANEOUS
  Filled 2021-03-14: qty 1

## 2021-03-14 MED ORDER — LABETALOL HCL 300 MG PO TABS
300.0000 mg | ORAL_TABLET | Freq: Two times a day (BID) | ORAL | 2 refills | Status: DC
Start: 2021-03-14 — End: 2021-04-30

## 2021-03-14 MED ORDER — LACTATED RINGERS IV BOLUS
1000.0000 mL | Freq: Once | INTRAVENOUS | Status: AC
  Administered 2021-03-14: 1000 mL via INTRAVENOUS

## 2021-03-14 MED ORDER — POTASSIUM CHLORIDE CRYS ER 20 MEQ PO TBCR
40.0000 meq | EXTENDED_RELEASE_TABLET | Freq: Once | ORAL | Status: AC
  Administered 2021-03-14: 40 meq via ORAL
  Filled 2021-03-14: qty 2

## 2021-03-14 MED ORDER — ACETAMINOPHEN 500 MG PO TABS
1000.0000 mg | ORAL_TABLET | Freq: Once | ORAL | Status: AC
Start: 2021-03-14 — End: 2021-03-14
  Administered 2021-03-14: 1000 mg via ORAL
  Filled 2021-03-14: qty 2

## 2021-03-14 MED ORDER — LABETALOL HCL 100 MG PO TABS
300.0000 mg | ORAL_TABLET | Freq: Once | ORAL | Status: AC
  Administered 2021-03-14: 300 mg via ORAL
  Filled 2021-03-14: qty 3

## 2021-03-14 NOTE — MAU Note (Signed)
Presents from MD office for BP evaluation.  Denies H/A, epigastric pain or visual disturbances.  Denies VB or LOF.  Endorses +FM. States has nasal congestion and right ear blockage.  States it's hard to hear.

## 2021-03-14 NOTE — Progress Notes (Signed)
   PRENATAL VISIT NOTE  Subjective:  Cindy Stewart is a 35 y.o. G3P2002 at [redacted]w[redacted]d being seen today for ongoing prenatal care. She is currently monitored for the following issues for this high-risk pregnancy and has Cervical cancer screening; Primary hypertension; Vitamin D deficiency; GERD (gastroesophageal reflux disease); Tobacco use; Unplanned pregnancy, unknown if wanted; Supervision of high risk pregnancy, antepartum; Chronic hypertension affecting pregnancy; and Abnormal antenatal AFP screen on their problem list.  Patient reports constant chest pain/pressure, headache, and blurry vision that started this morning. She denies palpitations, SOB, or any cardiac history. No cough, fever, congestion, or sore throat. Had a bloody nose this morning.  Has not taken anything to relieve pain. Contractions: Not present. Vag. Bleeding: None.  Movement: Present. Denies leaking of fluid.   The following portions of the patient's history were reviewed and updated as appropriate: allergies, current medications, past family history, past medical history, past social history, past surgical history and problem list.   Objective:   Vitals:   03/14/21 1429  BP: (!) 147/101  Pulse: (!) 101  Weight: 104 lb (47.2 kg)    Fetal Status: Fetal Heart Rate (bpm): 173 Fundal Height: 29 cm Movement: Present     General:  Alert, oriented and cooperative. Patient is in no acute distress.  Skin: Skin is warm and dry. No rash noted.   Cardiovascular: Normal heart rate noted  Respiratory: Normal respiratory effort, no problems with respiration noted  Abdomen: Soft, gravid, appropriate for gestational age.  Pain/Pressure: Present     Pelvic: Cervical exam deferred        Extremities: Normal range of motion.     Mental Status: Normal mood and affect. Normal behavior. Normal judgment and thought content.   Assessment and Plan:  Pregnancy: G3P2002 at [redacted]w[redacted]d  1. Supervision of high risk pregnancy, antepartum -  Routine OB care  - AMBULATORY REFERRAL TO [facility]  2. Chronic hypertension affecting pregnancy - No meds - BP elevated today. Pt endorsing constant chest pain/pressure, along with headache and blurry vision - Given patient symptoms, further work up indicated. Patient instructed to go to MAU for further evaluation. Dr. Adrian Blackwater made aware.   3. Language barrier affecting health care - In-person Nepali interpretor used for entirity of visit   Preterm labor symptoms and general obstetric precautions including but not limited to vaginal bleeding, contractions, leaking of fluid and fetal movement were reviewed in detail with the patient. Please refer to After Visit Summary for other counseling recommendations.   Return in about 2 weeks (around 03/28/2021).  Future Appointments  Date Time Provider Department Center  03/15/2021  8:50 AM [facility] LAB [facility]  04/04/2021  3:35 PM Warner Mccreedy, MD [facility]  04/06/2021  3:30 PM [facility] NURSE [facility]  04/06/2021  3:45 PM [facility] US6 [facility]    Brand Males, CNM 03/14/21 7:11 PM

## 2021-03-14 NOTE — MAU Provider Note (Signed)
History     CSN: [id number]  Arrival date and time: 03/14/21 1324   Chief Complaint  Patient presents with   BP Evaluation   A Nepali interpreter via ipad was used for the duration of evaluation.   Cindy Stewart is a 35 yo F G3P2002 at 96w3dgestation, with a known history of chronic hypertension not on any medication, presenting for evaluation of preeclampsia.  She was sent over from her OBs office due to endorsement of chest pressure, headache, blurred vision, and elevated blood pressure.  She denies any vaginal bleeding, leakage of fluid, decreased fetal movement, or contractions.  Upon arrival, she reports that she has not had any headache or blurred vision.  She does state that she has had chest pressure that started earlier today, "it is mild now."  The pressure sensation has been left-sided, worse when she picks up her 148-monthld son on that side.  No change in sensation with walking, laying flat/sitting up, or activities. Hasn't tried anything to make it better. She has been lifting her son more recently as she is alone within during the day. Denies any recent fever, coughing.  She denies any associated shortness of breath, palpitations, LE edema, or lightheadedness/dizziness.   She also reports right ear fullness.  Does feel like noise sounds a little different.  This happens every year around this time, associated with nasal congestion.  Denies full hearing loss or tinnitus. She hasn't tried anything to make it better.   [facility]  Her pregnancy has been complicated by chronic hypertension, iron deficiency anemia, and an abnormal AFP.    Past Medical History:  Diagnosis Date   Hypertension    Hypotension    Lupus ([facility]    Past Surgical History:  Procedure Laterality Date   NO PAST SURGERIES      Family History  Problem Relation Age of Onset   Cancer Mother     Social History   Tobacco Use   Smoking status: Never   Smokeless tobacco: Never  Vaping  Use   Vaping Use: Never used  Substance Use Topics   Alcohol use: No   Drug use: No    Allergies: No Known Allergies  Medications Prior to Admission  Medication Sig Dispense Refill Last Dose   Prenatal Vit-Fe Fumarate-FA (PRENATAL MULTIVITAMIN) TABS tablet Take 1 tablet by mouth daily at 12 noon.      aspirin EC 81 MG tablet Take 1 tablet (81 mg total) by mouth daily. Swallow whole. (Patient not taking: Reported on 03/14/2021) 30 tablet 5    Blood Pressure Monitoring (BLOOD PRESSURE KIT) DEVI 1 each by Does not apply route once a week. (Patient not taking: Reported on 03/09/2021) 1 each 0     Review of Systems  Constitutional:  Negative for fatigue and fever.  HENT:  Positive for congestion and ear pain. Negative for ear discharge, sinus pressure and sneezing.   Respiratory:  Positive for chest tightness. Negative for cough, shortness of breath and wheezing.   Cardiovascular:  Negative for palpitations and leg swelling.  Gastrointestinal:  Negative for abdominal pain, constipation, diarrhea, nausea and vomiting.  Genitourinary:  Negative for vaginal bleeding.  Musculoskeletal:  Negative for back pain and joint swelling.  Skin:  Negative for pallor and rash.  Neurological:  Negative for dizziness, syncope, weakness, light-headedness and headaches.  Physical Exam   Blood pressure (!) 150/92, pulse 99, temperature 98.2 F (36.8 C), temperature source Oral, resp. rate 20, last menstrual period  08/28/2020, SpO2 99 %, not currently breastfeeding.  Physical Exam Constitutional:      General: She is not in acute distress.    Appearance: Normal appearance. She is not ill-appearing.  HENT:     Head: Normocephalic and atraumatic.     Right Ear: Tympanic membrane and ear canal normal.     Left Ear: Tympanic membrane and ear canal normal.     Nose: Congestion present.     Mouth/Throat:     Mouth: Mucous membranes are moist.  Eyes:     Extraocular Movements: Extraocular movements intact.   Cardiovascular:     Rate and Rhythm: Normal rate and regular rhythm.     Pulses: Normal pulses.     Heart sounds: Normal heart sounds. No murmur heard.   No friction rub.  Pulmonary:     Effort: Pulmonary effort is normal.     Breath sounds: Normal breath sounds. No wheezing.  Chest:     Chest wall: No tenderness.  Abdominal:     General: There is no distension.     Palpations: Abdomen is soft.  Genitourinary:    Comments: Cervical exam:  1-1.5cm/30/-3  Musculoskeletal:     Cervical back: Normal range of motion.  Skin:    General: Skin is warm and dry.     Capillary Refill: Capillary refill takes less than 2 seconds.  Neurological:     General: No focal deficit present.     Mental Status: She is alert and oriented to person, place, and time.  Psychiatric:        Behavior: Behavior normal.    NST:  Baseline 140 Moderate variability  15x15 accels  No decels  Ctx every 2-5 minutes on toco>> after terb no contractions   MAU Course   MDM CBC, CMP, protein/creatinine ratio Troponin X2 and EKG Tylenol  Reassessed 1930: Noted frequent contractions approximately every 2-5 minutes on toco.  Re-discussed with patient, who states she has not been feeling any contractions or abdominal pain.  FFN collected and negative.  Cervical exam as above.  IV fluid bolus given with mild improvement in contraction frequency.  Discussed clinical presentation with Dr. Rip Harbour around 2045, recommended giving IM terbutaline without necessity to monitor overnight/BMZ. Agreed with starting labetalol for cHTN.   Reassessed 2230: Received Tylenol and given labetalol for blood pressures.  Reports she is feeling much better, no further chest pressure.  Assessment and Plan  1. Chest wall pain Worse with lifting her son, presentation most consistent with MSK.  Troponin negative 5>4.  EKG sinus and reassuring, single lead (III) with nonspecific ST change. VSS with normal effort on room air and benign  pulmonary exam, doubt PE or pulmonary process.  Continue Tylenol +/- heat/ice at home as needed.  2. Chronic hypertension affecting pregnancy Uncontrolled, elevated for the past month.  Asymptomatic. Pre-e labs WNL, however PCR has increased since baseline at 0.21.  Added labetalol 300 mg twice daily, first dose given here with significant improvement in BP.  Sent message to office for a 1 week RN visit BP check.   3. Seasonal allergies Encouraged antihistamine/nasal spray as needed.  4. [redacted] weeks gestation of pregnancy  5. Preterm contractions  Patient not feeling any contractions, present only on toco. FFN negative, about 1 cm dilated. IV LR bolus and terb given with resolution.   6. Hypokalemia  K 3.2. Given Kdur.   MAU precautions discussed, aware of PIH and pre-term labor symptoms.  Following up in the office tomorrow  for a lab glucola visit.   Patriciaann Clan 03/14/2021, 5:43 PM

## 2021-03-14 NOTE — Progress Notes (Signed)
Patient reports chest pains and ear "blockage". Patient also stated that she had a nose bleed this morning along with headache

## 2021-03-15 ENCOUNTER — Other Ambulatory Visit: Payer: Medicaid Other

## 2021-03-15 DIAGNOSIS — O0990 Supervision of high risk pregnancy, unspecified, unspecified trimester: Secondary | ICD-10-CM

## 2021-03-16 LAB — GLUCOSE TOLERANCE, 2 HOURS W/ 1HR
Glucose, 1 hour: 208 mg/dL — ABNORMAL HIGH (ref 70–179)
Glucose, 2 hour: 205 mg/dL — ABNORMAL HIGH (ref 70–152)
Glucose, Fasting: 107 mg/dL — ABNORMAL HIGH (ref 70–91)

## 2021-03-22 ENCOUNTER — Other Ambulatory Visit: Payer: Self-pay

## 2021-03-22 ENCOUNTER — Encounter: Payer: Self-pay | Admitting: Family Medicine

## 2021-03-22 ENCOUNTER — Ambulatory Visit (INDEPENDENT_AMBULATORY_CARE_PROVIDER_SITE_OTHER): Payer: Medicaid Other

## 2021-03-22 VITALS — BP 107/72 | HR 79 | Wt 103.5 lb

## 2021-03-22 DIAGNOSIS — Z0130 Encounter for examination of blood pressure without abnormal findings: Secondary | ICD-10-CM

## 2021-03-22 NOTE — Progress Notes (Signed)
Pt here today for BP check after start of Labetalol 300 mg po bid for elevated BP.  With video interpreter pt denies headache or visual disturbances.  BP LA 107/72.  Pt advised to continue to take medication as prescribed.  Pt encouraged to continue to monitor sx's of elevated BP and to call the office with concerns.  Pt reports that she has cough that is not going away.  Pt provided with list of medications safe to take in pregnancy and to take to nearest pharmacy. Pt verbalized understanding.   Lyall Faciane,RN  03/22/21

## 2021-03-23 NOTE — Progress Notes (Signed)
Chart reviewed for nurse visit. Agree with plan of care.   Trentin Knappenberger M, MD 03/23/21 12:53 PM 

## 2021-03-24 ENCOUNTER — Telehealth: Payer: Self-pay | Admitting: *Deleted

## 2021-03-24 ENCOUNTER — Other Ambulatory Visit: Payer: Self-pay

## 2021-03-24 DIAGNOSIS — O0990 Supervision of high risk pregnancy, unspecified, unspecified trimester: Secondary | ICD-10-CM

## 2021-03-24 DIAGNOSIS — O24419 Gestational diabetes mellitus in pregnancy, unspecified control: Secondary | ICD-10-CM | POA: Insufficient documentation

## 2021-03-24 MED ORDER — ACCU-CHEK GUIDE VI STRP: ORAL_STRIP | 12 refills | Status: DC

## 2021-03-24 MED ORDER — ACCU-CHEK SOFTCLIX LANCETS MISC: 12 refills | Status: DC

## 2021-03-24 MED ORDER — ACCU-CHEK GUIDE W/DEVICE KIT: 1.0000 | PACK | Freq: Once | 0 refills | Status: AC

## 2021-03-24 NOTE — Telephone Encounter (Signed)
I called Idell with [employer] [phone] and informed her of results and new dx GDM. I explained we will have her see our diabetes educator who will explain diet, how to check cbg's, etc. I offered appointment today and Tuesday but states she is unable to come then but accepted appointment 03/31/21.  Lachelle Rissler,RN

## 2021-03-24 NOTE — Telephone Encounter (Signed)
Supplies for GDM sent to pharmacy. I called patient with [facility] [phone]. I left a message I am calling back to let her know supplies have been sent to her pharmacy and please pick up and take to her appointment next week on Thursday.  Caysen Whang,RN

## 2021-03-24 NOTE — Telephone Encounter (Signed)
-----   Message from Brand Males, CNM sent at 03/18/2021 11:20 AM EST ----- Failed GTT. Needs supplies sent and to be scheduled with diabetes educator.  Thanks!

## 2021-03-28 ENCOUNTER — Other Ambulatory Visit: Payer: Self-pay | Admitting: *Deleted

## 2021-03-28 DIAGNOSIS — O24419 Gestational diabetes mellitus in pregnancy, unspecified control: Secondary | ICD-10-CM

## 2021-03-31 ENCOUNTER — Other Ambulatory Visit: Payer: Self-pay

## 2021-03-31 ENCOUNTER — Ambulatory Visit: Payer: Medicaid Other | Admitting: Registered"

## 2021-03-31 ENCOUNTER — Encounter: Payer: Medicaid Other | Admitting: Registered"

## 2021-03-31 DIAGNOSIS — Z3A00 Weeks of gestation of pregnancy not specified: Secondary | ICD-10-CM | POA: Diagnosis not present

## 2021-03-31 DIAGNOSIS — O24419 Gestational diabetes mellitus in pregnancy, unspecified control: Secondary | ICD-10-CM

## 2021-03-31 NOTE — Progress Notes (Signed)
In-person Neurosurgeon from [employer]  Patient was seen for Gestational Diabetes self-management on 03/31/21  Start time 1345 and End time 1445   Estimated due date: 05/13/2021; [redacted]w[redacted]d  Clinical: Medications: aspirin, prenatal Medical History: Pt reports previous diet-controlled GDM with last pregnancy Labs: OGTT 107(H)-208(H)-205(H), A1c 5.3%   Dietary and Lifestyle History: Pt states she eats a lot of rice and feels like it will be hard to eat only 1 cup of rice at a meal.   Pt states for about 1 week her sleep has been disturbed by burning sensation in hands, feet and legs. Note has been sent to clinical team. Encourage patient to discuss with MD at next visit which will be in a few days.  Patient eats a variety of foods, except does not eat cheese.   Physical Activity: ADLs "active taking care of young child" Stress: not assessed Sleep: affected by burning sensations  24 hr Recall:  First Meal: 8 oz warm milk, 1 piece of bread Snack: Second meal: rice, meat, vegetable Snack: fruit Third meal: rice, meat vegetable Snack: Beverages: water, 1 glass of milk  NUTRITION INTERVENTION  Nutrition education (E-1) on the following topics:   Initial Follow-up  [x]  []  Definition of Gestational Diabetes []  []  Why dietary management is important in controlling blood glucose [x]  []  Effects each nutrient has on blood glucose levels []  []  Simple carbohydrates vs complex carbohydrates []  []  Fluid intake [x]  []  Creating a balanced meal plan []  []  Carbohydrate counting  [x]  []  When to check blood glucose levels [x]  []  Proper blood glucose monitoring techniques [x]  []  Effect of stress and stress reduction techniques  [x]  []  Exercise effect on blood glucose levels, appropriate exercise during pregnancy []  []  Importance of limiting caffeine and abstaining from alcohol and smoking [x]  []  Medications used for blood sugar control during pregnancy []  []  Hypoglycemia and rule  of 15 [x]  []  Postpartum self care  Blood glucose monitor given: Accu-chek Guide Me Lot Exp: 06/03/2022 CBG: 169 mg/dL   Patient instructed to monitor glucose levels: FBS: 60 - ? 95 mg/dL (some clinics use 90 for cutoff) 1 hour: ? 140 mg/dL 2 hour: ? mg/dL  Patient received handouts: Nutrition Diabetes and Pregnancy Carbohydrate Counting List  Patient will be seen for follow-up as needed.

## 2021-04-04 ENCOUNTER — Other Ambulatory Visit: Payer: Self-pay

## 2021-04-04 ENCOUNTER — Ambulatory Visit (INDEPENDENT_AMBULATORY_CARE_PROVIDER_SITE_OTHER): Payer: Medicaid Other | Admitting: Family Medicine

## 2021-04-04 VITALS — BP 114/81 | HR 76 | Wt 107.0 lb

## 2021-04-04 DIAGNOSIS — Z23 Encounter for immunization: Secondary | ICD-10-CM

## 2021-04-04 DIAGNOSIS — Z3A34 34 weeks gestation of pregnancy: Secondary | ICD-10-CM

## 2021-04-04 DIAGNOSIS — Z789 Other specified health status: Secondary | ICD-10-CM

## 2021-04-04 DIAGNOSIS — O0990 Supervision of high risk pregnancy, unspecified, unspecified trimester: Secondary | ICD-10-CM

## 2021-04-04 DIAGNOSIS — O10919 Unspecified pre-existing hypertension complicating pregnancy, unspecified trimester: Secondary | ICD-10-CM

## 2021-04-04 DIAGNOSIS — Z5941 Food insecurity: Secondary | ICD-10-CM

## 2021-04-04 NOTE — Progress Notes (Signed)
   Subjective:  Cindy Stewart is a 35 y.o. G3P2002 at [redacted]w[redacted]d being seen today for ongoing prenatal care.  She is currently monitored for the following issues for this high-risk pregnancy and has Cervical cancer screening; Primary hypertension; Vitamin D deficiency; GERD (gastroesophageal reflux disease); Tobacco use; Unplanned pregnancy, unknown if wanted; Supervision of high risk pregnancy, antepartum; Chronic hypertension affecting pregnancy; Abnormal antenatal AFP screen; and Gestational diabetes mellitus (GDM) on their problem list.  Patient reports no complaints.  Contractions: Not present. Vag. Bleeding: None.  Movement: Present. Denies leaking of fluid.   The following portions of the patient's history were reviewed and updated as appropriate: allergies, current medications, past family history, past medical history, past social history, past surgical history and problem list. Problem list updated.  Objective:   Vitals:   04/04/21 1551  BP: 114/81  Pulse: 76  Weight: 107 lb (48.5 kg)    Fetal Status: Fetal Heart Rate (bpm): 134   Movement: Present     General:  Alert, oriented and cooperative. Patient is in no acute distress.  Skin: Skin is warm and dry. No rash noted.   Cardiovascular: Normal heart rate noted  Respiratory: Normal respiratory effort, no problems with respiration noted  Abdomen: Soft, gravid, appropriate for gestational age. Pain/Pressure: Absent     Pelvic: Vag. Bleeding: None     Cervical exam deferred        Extremities: Normal range of motion.  Edema: None  Mental Status: Normal mood and affect. Normal behavior. Normal judgment and thought content.    Assessment and Plan:  Pregnancy: G3P2002 at [redacted]w[redacted]d  1. Supervision of high risk pregnancy, antepartum Doing well. Fundal height and fetal heart tones normal. No acute concerns at this time. - follow up in 1 week - swabs at next visit if after 36 weeks  2. Language barrier affecting health care In person  interpreter present for entire visit  3. [redacted] weeks gestation of pregnancy  4. Chronic hypertension affecting pregnancy On Labetalol 300mg  BID. Today BP normotensive. Remains asymptomatic - plan for IOL at 37 weeks since cHTN on meds - has follow up scheduled on 12/14  5. Food insecurity - AMBULATORY REFERRAL TO [facility]  6. GDMA1 Not checking BG. Did not realize she needed to check and not confident about checking. Discussed in detail regarding how to check.  Offered coming in for RN visit for more teaching with BG machine once she has it with her. Patient would like to come in - RN visit in  two days when she is coming for her ultrasound - follow up in 1 week  Preterm labor symptoms and general obstetric precautions including but not limited to vaginal bleeding, contractions, leaking of fluid and fetal movement were reviewed in detail with the patient. Please refer to After Visit Summary for other counseling recommendations.  Return in about 1 week (around 04/11/2021) for HROB, also RN visit on 12/14 for BG teaching. 1/15, MD, MPH OB Fellow, Faculty Practice

## 2021-04-06 ENCOUNTER — Ambulatory Visit: Payer: Medicaid Other | Admitting: *Deleted

## 2021-04-06 ENCOUNTER — Other Ambulatory Visit: Payer: Self-pay | Admitting: *Deleted

## 2021-04-06 ENCOUNTER — Ambulatory Visit: Payer: Medicaid Other | Attending: Obstetrics and Gynecology

## 2021-04-06 ENCOUNTER — Other Ambulatory Visit: Payer: Self-pay

## 2021-04-06 ENCOUNTER — Ambulatory Visit: Payer: Medicaid Other

## 2021-04-06 ENCOUNTER — Other Ambulatory Visit: Payer: Self-pay | Admitting: Obstetrics and Gynecology

## 2021-04-06 VITALS — BP 125/87 | HR 79

## 2021-04-06 DIAGNOSIS — O10913 Unspecified pre-existing hypertension complicating pregnancy, third trimester: Secondary | ICD-10-CM

## 2021-04-06 DIAGNOSIS — O280 Abnormal hematological finding on antenatal screening of mother: Secondary | ICD-10-CM | POA: Insufficient documentation

## 2021-04-06 DIAGNOSIS — O09523 Supervision of elderly multigravida, third trimester: Secondary | ICD-10-CM

## 2021-04-06 DIAGNOSIS — O24419 Gestational diabetes mellitus in pregnancy, unspecified control: Secondary | ICD-10-CM | POA: Insufficient documentation

## 2021-04-06 DIAGNOSIS — O0990 Supervision of high risk pregnancy, unspecified, unspecified trimester: Secondary | ICD-10-CM | POA: Insufficient documentation

## 2021-04-06 DIAGNOSIS — Z3689 Encounter for other specified antenatal screening: Secondary | ICD-10-CM

## 2021-04-06 DIAGNOSIS — Z3A34 34 weeks gestation of pregnancy: Secondary | ICD-10-CM | POA: Diagnosis not present

## 2021-04-06 DIAGNOSIS — O10013 Pre-existing essential hypertension complicating pregnancy, third trimester: Secondary | ICD-10-CM

## 2021-04-06 DIAGNOSIS — O99333 Smoking (tobacco) complicating pregnancy, third trimester: Secondary | ICD-10-CM

## 2021-04-11 ENCOUNTER — Other Ambulatory Visit: Payer: Self-pay

## 2021-04-12 ENCOUNTER — Other Ambulatory Visit: Payer: Medicaid Other | Admitting: Registered"

## 2021-04-13 ENCOUNTER — Other Ambulatory Visit (HOSPITAL_COMMUNITY)
Admission: RE | Admit: 2021-04-13 | Discharge: 2021-04-13 | Disposition: A | Discharge status: Home / Self Care | Payer: Medicaid Other | Source: Ambulatory Visit | Attending: Family Medicine | Admitting: Family Medicine

## 2021-04-13 ENCOUNTER — Other Ambulatory Visit: Payer: Self-pay

## 2021-04-13 ENCOUNTER — Ambulatory Visit (INDEPENDENT_AMBULATORY_CARE_PROVIDER_SITE_OTHER): Payer: Medicaid Other | Admitting: Family Medicine

## 2021-04-13 VITALS — BP 123/90 | HR 79 | Wt 106.0 lb

## 2021-04-13 DIAGNOSIS — O0990 Supervision of high risk pregnancy, unspecified, unspecified trimester: Secondary | ICD-10-CM | POA: Diagnosis not present

## 2021-04-13 DIAGNOSIS — Z3A35 35 weeks gestation of pregnancy: Secondary | ICD-10-CM | POA: Diagnosis not present

## 2021-04-13 DIAGNOSIS — O10919 Unspecified pre-existing hypertension complicating pregnancy, unspecified trimester: Secondary | ICD-10-CM

## 2021-04-13 DIAGNOSIS — O24419 Gestational diabetes mellitus in pregnancy, unspecified control: Secondary | ICD-10-CM | POA: Diagnosis not present

## 2021-04-13 LAB — OB RESULTS CONSOLE GC/CHLAMYDIA: Gonorrhea: NEGATIVE

## 2021-04-13 MED ORDER — METFORMIN HCL 500 MG PO TABS: 500.0000 mg | ORAL_TABLET | Freq: Two times a day (BID) | ORAL | 1 refills | Status: DC

## 2021-04-13 NOTE — Progress Notes (Signed)
° ° °  Subjective:  Cindy Stewart is a 35 y.o. G3P2002 at [redacted]w[redacted]d being seen today for ongoing prenatal care.  She is currently monitored for the following issues for this high-risk pregnancy and has Vitamin D deficiency; GERD (gastroesophageal reflux disease); Tobacco use; Unplanned pregnancy, unknown if wanted; Supervision of high risk pregnancy, antepartum; Chronic hypertension affecting pregnancy; Abnormal antenatal AFP screen; and Gestational diabetes mellitus (GDM) on their problem list.  Patient reports no complaints.  Contractions: Not present. Vag. Bleeding: None.  Movement: Present. Denies leaking of fluid.   Checking her CBGs now. Her sister is wanting to adopt this child, wants to know the correct process for this to occur.   The following portions of the patient's history were reviewed and updated as appropriate: allergies, current medications, past family history, past medical history, past social history, past surgical history and problem list.   Objective:   Vitals:   04/13/21 0927  BP: 123/90  Pulse: 79  Weight: 106 lb (48.1 kg)    Fetal Status: Fetal Heart Rate (bpm): 140 Fundal Height: 35 cm Movement: Present   Vertex by [place]   General:  Alert, oriented and cooperative. Patient is in no acute distress.  Skin: Skin is warm and dry. No rash noted.   Cardiovascular: Normal heart rate noted  Respiratory: Normal respiratory effort, no problems with respiration noted  Abdomen: Soft, gravid, appropriate for gestational age. Pain/Pressure: Present     Pelvic:  Cervical exam deferred, NEFG, gc/ch/GBS collected         Extremities: Normal range of motion.  Edema: None  Mental Status: Normal mood and affect. Normal behavior. Normal judgment and thought content.     Assessment and Plan:  Pregnancy: G3P2002 at [redacted]w[redacted]d  1. Supervision of high risk pregnancy, antepartum Doing well with normal fetal movement.   2. [redacted] weeks gestation of pregnancy Gc/ch/GBS was collected.    3. Chronic hypertension affecting pregnancy Well controlled on labetalol. Cont as is. Weekly antenatal testing.   4. Gestational diabetes mellitus (GDM) in third trimester, gestational diabetes method of control unspecified Just started checking sugars about 1.5 weeks ago, uncontrolled post-prandial, no documented fasting but reports 90-100's. Growth [place] at [redacted]w[redacted]d with EFW 13%. 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 Rx'd metformin 500mg  BID. Cont monitoring CBGs. Plan for IOL around 38 weeks for now after speaking with Dr. , can change if needed. Cont weekly BPP, scheduled for tomorrow already.   5. Placing child for adoption  Sister wants to adopt child. Provided [facility] [employer] [place] information to start process.   Preterm labor symptoms and general obstetric precautions including but not limited to vaginal bleeding, contractions, leaking of fluid and fetal movement were reviewed in detail with the patient. Please refer to After Visit Summary for other counseling recommendations.   Return in about 6 days (around 04/19/2021) for HROB.  04/21/2021, DO

## 2021-04-14 ENCOUNTER — Ambulatory Visit: Payer: Medicaid Other | Attending: Obstetrics and Gynecology

## 2021-04-14 ENCOUNTER — Ambulatory Visit: Payer: Medicaid Other | Admitting: *Deleted

## 2021-04-14 VITALS — BP 126/86 | HR 92

## 2021-04-14 DIAGNOSIS — Z3A35 35 weeks gestation of pregnancy: Secondary | ICD-10-CM

## 2021-04-14 DIAGNOSIS — O10913 Unspecified pre-existing hypertension complicating pregnancy, third trimester: Secondary | ICD-10-CM | POA: Diagnosis present

## 2021-04-14 DIAGNOSIS — Z3689 Encounter for other specified antenatal screening: Secondary | ICD-10-CM

## 2021-04-14 DIAGNOSIS — O280 Abnormal hematological finding on antenatal screening of mother: Secondary | ICD-10-CM

## 2021-04-14 DIAGNOSIS — O10013 Pre-existing essential hypertension complicating pregnancy, third trimester: Secondary | ICD-10-CM | POA: Diagnosis not present

## 2021-04-14 DIAGNOSIS — O0990 Supervision of high risk pregnancy, unspecified, unspecified trimester: Secondary | ICD-10-CM | POA: Insufficient documentation

## 2021-04-14 DIAGNOSIS — O24419 Gestational diabetes mellitus in pregnancy, unspecified control: Secondary | ICD-10-CM

## 2021-04-14 LAB — CERVICOVAGINAL ANCILLARY ONLY
Chlamydia: NEGATIVE
Comment: NEGATIVE
Comment: NORMAL
Neisseria Gonorrhea: NEGATIVE

## 2021-04-15 ENCOUNTER — Encounter: Payer: Self-pay | Admitting: Family Medicine

## 2021-04-17 LAB — CULTURE, BETA STREP (GROUP B ONLY): Strep Gp B Culture: NEGATIVE

## 2021-04-20 ENCOUNTER — Ambulatory Visit: Payer: Medicaid Other | Admitting: *Deleted

## 2021-04-20 ENCOUNTER — Other Ambulatory Visit: Payer: Self-pay

## 2021-04-20 ENCOUNTER — Ambulatory Visit: Payer: Medicaid Other | Attending: Obstetrics and Gynecology

## 2021-04-20 VITALS — BP 141/99 | HR 83

## 2021-04-20 DIAGNOSIS — O0990 Supervision of high risk pregnancy, unspecified, unspecified trimester: Secondary | ICD-10-CM

## 2021-04-20 DIAGNOSIS — O280 Abnormal hematological finding on antenatal screening of mother: Secondary | ICD-10-CM | POA: Insufficient documentation

## 2021-04-20 DIAGNOSIS — O10913 Unspecified pre-existing hypertension complicating pregnancy, third trimester: Secondary | ICD-10-CM

## 2021-04-20 DIAGNOSIS — O24419 Gestational diabetes mellitus in pregnancy, unspecified control: Secondary | ICD-10-CM

## 2021-04-20 DIAGNOSIS — O10013 Pre-existing essential hypertension complicating pregnancy, third trimester: Secondary | ICD-10-CM

## 2021-04-20 DIAGNOSIS — Z3A36 36 weeks gestation of pregnancy: Secondary | ICD-10-CM

## 2021-04-20 DIAGNOSIS — Z3689 Encounter for other specified antenatal screening: Secondary | ICD-10-CM | POA: Diagnosis present

## 2021-04-20 DIAGNOSIS — O09523 Supervision of elderly multigravida, third trimester: Secondary | ICD-10-CM | POA: Diagnosis not present

## 2021-04-20 NOTE — Progress Notes (Signed)
Pt c/o epigastric pain feeling tight for the past month; denies any other signs or symptoms of pre-e.

## 2021-04-24 NOTE — L&D Delivery Note (Signed)
OB/GYN Faculty Practice Delivery Note  Cindy Stewart is a 36 y.o. F7T0240 s/p SVD at [redacted]w[redacted]d. She was admitted for SOL.   ROM: 0h 63m with clear fluid GBS Status:  Negative/-- (12/21 1002)  Labor Progress: Initial SVE: 8/100. She then progressed to complete shortly after arrival to L&D with expectant management.   Delivery Date/Time: 1/5, 0940  Delivery: Called to room when patient arrived to L&D with involuntary pushing shortly after SROM. Head delivered L OA. No nuchal cord present. Shoulder and body delivered in usual fashion. Infant with spontaneous cry, placed on mother's abdomen, dried and stimulated. Cord clamped x 2 after 30 second delay due to continued poor tone, cut by provider. Cord blood drawn. Placenta delivered spontaneously with gentle cord traction. Fundus firm with massage and Pitocin. Labia, perineum, vagina, and cervix inspected with a 1st degree perineal laceration. Repaired in usual fashion by Cindy Moll, MD, PGY-2 under my supervision with good hemostasis achieved.  Baby Weight: pending  Placenta: 3 vessel, intact. Sent to L&D Complications: None Lacerations: 1st degree perineal  EBL: 150 mL Analgesia: Lidocaine   Infant:  APGAR (1 MIN): 7   APGAR (5 MINS): 8    Cindy Penna, DO  OB Family Medicine Fellow, [facility] for [employer], [facility] 04/28/2021, 11:09 AM

## 2021-04-28 ENCOUNTER — Other Ambulatory Visit: Payer: Self-pay

## 2021-04-28 ENCOUNTER — Encounter: Payer: Medicaid Other | Admitting: Advanced Practice Midwife

## 2021-04-28 ENCOUNTER — Inpatient Hospital Stay (HOSPITAL_COMMUNITY)
Admission: AD | Admit: 2021-04-28 | Discharge: 2021-04-30 | DRG: 805 | Disposition: A | Discharge status: Home / Self Care | Payer: Medicaid Other | Attending: Obstetrics and Gynecology | Admitting: Obstetrics and Gynecology

## 2021-04-28 ENCOUNTER — Encounter (HOSPITAL_COMMUNITY): Payer: Self-pay | Admitting: Obstetrics and Gynecology

## 2021-04-28 DIAGNOSIS — O24419 Gestational diabetes mellitus in pregnancy, unspecified control: Secondary | ICD-10-CM

## 2021-04-28 DIAGNOSIS — O26893 Other specified pregnancy related conditions, third trimester: Secondary | ICD-10-CM | POA: Diagnosis present

## 2021-04-28 DIAGNOSIS — Z30017 Encounter for initial prescription of implantable subdermal contraceptive: Secondary | ICD-10-CM | POA: Diagnosis not present

## 2021-04-28 DIAGNOSIS — O114 Pre-existing hypertension with pre-eclampsia, complicating childbirth: Principal | ICD-10-CM | POA: Diagnosis present

## 2021-04-28 DIAGNOSIS — O24425 Gestational diabetes mellitus in childbirth, controlled by oral hypoglycemic drugs: Secondary | ICD-10-CM | POA: Diagnosis present

## 2021-04-28 DIAGNOSIS — Z7982 Long term (current) use of aspirin: Secondary | ICD-10-CM | POA: Diagnosis not present

## 2021-04-28 DIAGNOSIS — U071 COVID-19: Secondary | ICD-10-CM | POA: Diagnosis present

## 2021-04-28 DIAGNOSIS — O1404 Mild to moderate pre-eclampsia, complicating childbirth: Secondary | ICD-10-CM

## 2021-04-28 DIAGNOSIS — Z3A37 37 weeks gestation of pregnancy: Secondary | ICD-10-CM

## 2021-04-28 DIAGNOSIS — O280 Abnormal hematological finding on antenatal screening of mother: Secondary | ICD-10-CM | POA: Diagnosis present

## 2021-04-28 DIAGNOSIS — O1002 Pre-existing essential hypertension complicating childbirth: Secondary | ICD-10-CM | POA: Diagnosis present

## 2021-04-28 DIAGNOSIS — O10919 Unspecified pre-existing hypertension complicating pregnancy, unspecified trimester: Secondary | ICD-10-CM | POA: Diagnosis present

## 2021-04-28 DIAGNOSIS — Z3490 Encounter for supervision of normal pregnancy, unspecified, unspecified trimester: Secondary | ICD-10-CM

## 2021-04-28 DIAGNOSIS — O9852 Other viral diseases complicating childbirth: Secondary | ICD-10-CM | POA: Diagnosis present

## 2021-04-28 LAB — COMPREHENSIVE METABOLIC PANEL
ALT: 93 U/L — ABNORMAL HIGH (ref 0–44)
AST: 90 U/L — ABNORMAL HIGH (ref 15–41)
Albumin: 2.9 g/dL — ABNORMAL LOW (ref 3.5–5.0)
Alkaline Phosphatase: 335 U/L — ABNORMAL HIGH (ref 38–126)
Anion gap: 11 (ref 5–15)
BUN: <5 mg/dL — ABNORMAL LOW (ref 6–20)
CO2: 15 mmol/L — ABNORMAL LOW (ref 22–32)
Calcium: 8.6 mg/dL — ABNORMAL LOW (ref 8.9–10.3)
Chloride: 107 mmol/L (ref 98–111)
Creatinine, Ser: 0.68 mg/dL (ref 0.44–1.00)
GFR, Estimated: >60 mL/min (ref >60)
Glucose, Bld: 125 mg/dL — ABNORMAL HIGH (ref 70–99)
Potassium: 4.6 mmol/L (ref 3.5–5.1)
Sodium: 133 mmol/L — ABNORMAL LOW (ref 135–145)
Total Bilirubin: 0.7 mg/dL (ref 0.3–1.2)
Total Protein: 7.2 g/dL (ref 6.5–8.1)

## 2021-04-28 LAB — TYPE AND SCREEN
ABO/RH(D): A POS
Antibody Screen: NEGATIVE

## 2021-04-28 LAB — RESP PANEL BY RT-PCR (FLU A&B, COVID) ARPGX2
Influenza A by PCR: NEGATIVE
Influenza B by PCR: NEGATIVE
SARS Coronavirus 2 by RT PCR: POSITIVE — AB

## 2021-04-28 LAB — CBC
HCT: 37.1 % (ref 36.0–46.0)
Hemoglobin: 12.1 g/dL (ref 12.0–15.0)
MCH: 26.7 pg (ref 26.0–34.0)
MCHC: 32.6 g/dL (ref 30.0–36.0)
MCV: 81.9 fL (ref 80.0–100.0)
Platelets: 255 10*3/uL (ref 150–400)
RBC: 4.53 MIL/uL (ref 3.87–5.11)
RDW: 13.9 % (ref 11.5–15.5)
WBC: 17.5 10*3/uL — ABNORMAL HIGH (ref 4.0–10.5)
nRBC: 0 % (ref 0.0–0.2)

## 2021-04-28 LAB — RPR: RPR Ser Ql: NONREACTIVE

## 2021-04-28 MED ORDER — SODIUM CHLORIDE 0.9% FLUSH: 3.0000 mL | INTRAVENOUS | Status: DC | PRN

## 2021-04-28 MED ORDER — BENZOCAINE-MENTHOL 20-0.5 % EX AERO
1.0000 "application " | INHALATION_SPRAY | CUTANEOUS | Status: DC | PRN
  Administered 2021-04-29: 1 via TOPICAL
  Filled 2021-04-28: qty 56

## 2021-04-28 MED ORDER — LACTATED RINGERS IV SOLN: INTRAVENOUS | Status: DC

## 2021-04-28 MED ORDER — LABETALOL HCL 5 MG/ML IV SOLN: 80.0000 mg | INTRAVENOUS | Status: DC | PRN

## 2021-04-28 MED ORDER — OXYCODONE-ACETAMINOPHEN 5-325 MG PO TABS: 1.0000 | ORAL_TABLET | ORAL | Status: DC | PRN

## 2021-04-28 MED ORDER — SODIUM CHLORIDE 0.9 % IV SOLN: 250.0000 mL | INTRAVENOUS | Status: DC | PRN

## 2021-04-28 MED ORDER — LACTATED RINGERS IV SOLN: 500.0000 mL | INTRAVENOUS | Status: DC | PRN

## 2021-04-28 MED ORDER — SIMETHICONE 80 MG PO CHEW: 80.0000 mg | CHEWABLE_TABLET | ORAL | Status: DC | PRN

## 2021-04-28 MED ORDER — LABETALOL HCL 5 MG/ML IV SOLN
20.0000 mg | INTRAVENOUS | Status: DC | PRN
  Administered 2021-04-28: 20 mg via INTRAVENOUS

## 2021-04-28 MED ORDER — DIPHENHYDRAMINE HCL 25 MG PO CAPS: 25.0000 mg | ORAL_CAPSULE | Freq: Four times a day (QID) | ORAL | Status: DC | PRN

## 2021-04-28 MED ORDER — ONDANSETRON HCL 4 MG/2ML IJ SOLN: 4.0000 mg | INTRAMUSCULAR | Status: DC | PRN

## 2021-04-28 MED ORDER — ONDANSETRON HCL 4 MG/2ML IJ SOLN: 4.0000 mg | Freq: Four times a day (QID) | INTRAMUSCULAR | Status: DC | PRN

## 2021-04-28 MED ORDER — HYDRALAZINE HCL 20 MG/ML IJ SOLN: 10.0000 mg | INTRAMUSCULAR | Status: DC | PRN

## 2021-04-28 MED ORDER — TETANUS-DIPHTH-ACELL PERTUSSIS 5-2.5-18.5 LF-MCG/0.5 IM SUSY: 0.5000 mL | PREFILLED_SYRINGE | Freq: Once | INTRAMUSCULAR | Status: DC

## 2021-04-28 MED ORDER — LABETALOL HCL 5 MG/ML IV SOLN: 40.0000 mg | INTRAVENOUS | Status: DC | PRN

## 2021-04-28 MED ORDER — LIDOCAINE HCL (PF) 1 % IJ SOLN
30.0000 mL | INTRAMUSCULAR | Status: AC | PRN
  Administered 2021-04-28: 30 mL via SUBCUTANEOUS
  Filled 2021-04-28: qty 30

## 2021-04-28 MED ORDER — OXYTOCIN BOLUS FROM INFUSION
333.0000 mL | Freq: Once | INTRAVENOUS | Status: AC
  Administered 2021-04-28: 333 mL via INTRAVENOUS

## 2021-04-28 MED ORDER — LABETALOL HCL 5 MG/ML IV SOLN: 20.0000 mg | INTRAVENOUS | Status: DC | PRN

## 2021-04-28 MED ORDER — COCONUT OIL OIL: 1.0000 "application " | TOPICAL_OIL | Status: DC | PRN

## 2021-04-28 MED ORDER — LABETALOL HCL 5 MG/ML IV SOLN
INTRAVENOUS | Status: AC
  Filled 2021-04-28: qty 4

## 2021-04-28 MED ORDER — SODIUM CHLORIDE 0.9% FLUSH
3.0000 mL | Freq: Two times a day (BID) | INTRAVENOUS | Status: DC
  Administered 2021-04-29 (×2): 3 mL via INTRAVENOUS

## 2021-04-28 MED ORDER — WITCH HAZEL-GLYCERIN EX PADS: 1.0000 "application " | MEDICATED_PAD | CUTANEOUS | Status: DC | PRN

## 2021-04-28 MED ORDER — OXYCODONE-ACETAMINOPHEN 5-325 MG PO TABS: 2.0000 | ORAL_TABLET | ORAL | Status: DC | PRN

## 2021-04-28 MED ORDER — DIBUCAINE (PERIANAL) 1 % EX OINT: 1.0000 "application " | TOPICAL_OINTMENT | CUTANEOUS | Status: DC | PRN

## 2021-04-28 MED ORDER — IBUPROFEN 600 MG PO TABS
600.0000 mg | ORAL_TABLET | Freq: Four times a day (QID) | ORAL | Status: DC
  Administered 2021-04-28 – 2021-04-30 (×7): 600 mg via ORAL
  Filled 2021-04-28 (×7): qty 1

## 2021-04-28 MED ORDER — MAGNESIUM SULFATE 40 GM/1000ML IV SOLN
2.0000 g/h | INTRAVENOUS | Status: AC
  Filled 2021-04-28 (×3): qty 1000

## 2021-04-28 MED ORDER — FUROSEMIDE 40 MG PO TABS
20.0000 mg | ORAL_TABLET | Freq: Every day | ORAL | Status: DC
  Administered 2021-04-29 – 2021-04-30 (×2): 20 mg via ORAL
  Filled 2021-04-28 (×2): qty 1

## 2021-04-28 MED ORDER — ACETAMINOPHEN 325 MG PO TABS: 650.0000 mg | ORAL_TABLET | ORAL | Status: DC | PRN

## 2021-04-28 MED ORDER — SOD CITRATE-CITRIC ACID 500-334 MG/5ML PO SOLN: 30.0000 mL | ORAL | Status: DC | PRN

## 2021-04-28 MED ORDER — NIFEDIPINE ER OSMOTIC RELEASE 30 MG PO TB24
30.0000 mg | ORAL_TABLET | Freq: Every day | ORAL | Status: DC
  Administered 2021-04-28: 30 mg via ORAL
  Filled 2021-04-28: qty 1

## 2021-04-28 MED ORDER — ONDANSETRON HCL 4 MG PO TABS: 4.0000 mg | ORAL_TABLET | ORAL | Status: DC | PRN

## 2021-04-28 MED ORDER — SENNOSIDES-DOCUSATE SODIUM 8.6-50 MG PO TABS
2.0000 | ORAL_TABLET | ORAL | Status: DC
  Administered 2021-04-28 – 2021-04-29 (×2): 2 via ORAL
  Filled 2021-04-28 (×2): qty 2

## 2021-04-28 MED ORDER — MAGNESIUM SULFATE BOLUS VIA INFUSION
4.0000 g | Freq: Once | INTRAVENOUS | Status: AC
  Administered 2021-04-28: 4 g via INTRAVENOUS
  Filled 2021-04-28: qty 1000

## 2021-04-28 MED ORDER — KETOROLAC TROMETHAMINE 30 MG/ML IJ SOLN
30.0000 mg | Freq: Once | INTRAMUSCULAR | Status: AC
  Administered 2021-04-28: 30 mg via INTRAVENOUS
  Filled 2021-04-28: qty 1

## 2021-04-28 MED ORDER — LABETALOL HCL 5 MG/ML IV SOLN
40.0000 mg | INTRAVENOUS | Status: DC | PRN
  Administered 2021-04-28: 40 mg via INTRAVENOUS
  Filled 2021-04-28: qty 8

## 2021-04-28 MED ORDER — MEASLES, MUMPS & RUBELLA VAC IJ SOLR: 0.5000 mL | Freq: Once | INTRAMUSCULAR | Status: DC

## 2021-04-28 MED ORDER — ZOLPIDEM TARTRATE 5 MG PO TABS: 5.0000 mg | ORAL_TABLET | Freq: Every evening | ORAL | Status: DC | PRN

## 2021-04-28 MED ORDER — PRENATAL MULTIVITAMIN CH
1.0000 | ORAL_TABLET | Freq: Every day | ORAL | Status: DC
  Administered 2021-04-28 – 2021-04-29 (×2): 1 via ORAL
  Filled 2021-04-28 (×2): qty 1

## 2021-04-28 MED ORDER — OXYTOCIN-SODIUM CHLORIDE 30-0.9 UT/500ML-% IV SOLN
2.5000 [IU]/h | INTRAVENOUS | Status: DC
  Filled 2021-04-28: qty 500

## 2021-04-28 NOTE — Progress Notes (Signed)
Post Partum Day 0 Subjective: Transferred to [facility] for severe PreE by BP and elevated LFTs. Pt unsure why she is down here. She denies HA/BV/RUQ pain.   Objective: Blood pressure (!) 142/95, pulse 78, temperature 97.9 F (36.6 C), temperature source Oral, resp. rate 16, last menstrual period 08/28/2020, SpO2 100 %, unknown if currently breastfeeding.  Physical Exam:  General: alert, cooperative, and no distress Lochia: appropriate Uterine Fundus: firm Incision: n/a DVT Evaluation: No evidence of DVT seen on physical exam. No cords or calf tenderness.  Recent Labs    04/28/21 0944  HGB 12.1  HCT 37.1    Assessment/Plan: CHTN with super-imposed PreE with severe features by BP/LFTs - Mag x24 hours from delivery - discussed purpose of magnesium prophylaxis - Will recheck labs in the morning re: LFTs. Hepatitis testing in October was negative for HBV and HCV.  - Procardia 30 for blood pressure control initially  Nepali Speaking - Interpreter used throughout our discussion  Routine PP care - Desires Nexplanon - Rh pos, Rubella immune  GDM - Fasting CBG in AM - was poor control on Metformin, will need 2 hr gtt postpartum   LOS: 0 days   Milas Hock 04/28/2021, 4:57 PM

## 2021-04-28 NOTE — Progress Notes (Signed)
Noted severe range pressures on arrival, however attributed to chronic hypertension without medication yet today and active labor. Received 20>40mg  IV labetalol with improvement. BP now 140-150's systolic. Otherwise asymptomatic.   CMP just returned with new elevated (>2x upper normal) AST/ALT 90/93 respectively. COVID positive, but appears asymptomatic overall.   Will go ahead and treat as pre-eclampsia with severe features. Start Magnesium IV and transfer to [facility] speciality care. Repeat labs for tomorrow am. Discussed with Dr. Debroah Loop.   Allayne Stack, DO

## 2021-04-28 NOTE — Discharge Summary (Signed)
Postpartum Discharge Summary  Date of Service updated     Patient Name: Cindy Stewart DOB: 1985/11/24 MRN: [id number]  Date of admission: 04/28/2021 Delivery date:04/28/2021  Delivering provider: Patriciaann Clan  Date of discharge: 04/30/2021  Admitting diagnosis: GDM, class A2 [O24.419] Intrauterine pregnancy: [redacted]w[redacted]d    Secondary diagnosis:  Principal Problem:   GDM, class A2 Active Problems:   Chronic hypertension affecting pregnancy   Abnormal antenatal AFP screen   Pregnancy with adoption planned  Additional problems: Nexplanon placed postpartum, COVID positive (asymptomatic)   Discharge diagnosis: Term Pregnancy Delivered, Preeclampsia (severe), CHTN with superimposed preeclampsia, and GDM A2                                              Post partum procedures: Nexplanon insertion Augmentation: N/A Complications: None  Hospital course: Onset of Labor With Vaginal Delivery      36y.o. yo G3P3003 at 371w6das admitted in Active Labor on 04/28/2021. Patient had an uncomplicated labor course as follows:  Membrane Rupture Time/Date: 9:32 AM ,04/28/2021   Delivery Method:Vaginal, Spontaneous  Episiotomy: None  Lacerations:  1st degree  Patient had a postpartum course complicated by PreE with severe features by BP and elevated LFTs. Initially she was thought to have elevated Bps due to pain but they persisted after delivery. [facility] on Magnesium for 24 hours following delivery. She was started on Lasix and Procarda for her blood pressure which was titrated to control her blood pressures to a goal of below 135/85. On PPD2 she had a nexplanon inserted.  She is ambulating, tolerating a regular diet, passing flatus, and urinating well. Patient is discharged home in stable condition on 04/30/21.  Newborn Data: Birth date:04/28/2021  Birth time:9:40 AM  Gender:Female  Living status:Living  Apgars:7 ,8  Weight:2690 g   Magnesium Sulfate  received: No BMZ received: No Rhophylac:No MMR:No T-DaP:Given prenatally Flu: No Transfusion:No  Physical exam  Vitals:   04/29/21 1953 04/29/21 2331 04/30/21 0352 04/30/21 0737  BP: (!) 139/95 (!) 134/99 117/77 119/76  Pulse: 79 78 78 72  Resp: '16 16 16 16  ' Temp: 98.3 F (36.8 C) 98 F (36.7 C) 98 F (36.7 C) 97.9 F (36.6 C)  TempSrc: Oral Oral Oral Oral  SpO2:  100% 100% 100%   General: alert, cooperative, and no distress Lochia: appropriate Uterine Fundus: firm Incision: N/A DVT Evaluation: No evidence of DVT seen on physical exam. No cords or calf tenderness. Labs: Lab Results  Component Value Date   WBC 20.7 (H) 04/29/2021   HGB 11.3 (L) 04/29/2021   HCT 34.1 (L) 04/29/2021   MCV 80.8 04/29/2021   PLT 291 04/29/2021   CMP Latest Ref Rng & Units 04/29/2021  Glucose 70 - 99 mg/dL 140(H)  BUN 6 - 20 mg/dL 6  Creatinine 0.44 - 1.00 mg/dL 0.66  Sodium 135 - 145 mmol/L 132(L)  Potassium 3.5 - 5.1 mmol/L 3.3(L)  Chloride 98 - 111 mmol/L 102  CO2 22 - 32 mmol/L 20(L)  Calcium 8.9 - 10.3 mg/dL 7.8(L)  Total Protein 6.5 - 8.1 g/dL 5.7(L)  Total Bilirubin 0.3 - 1.2 mg/dL 0.2(L)  Alkaline Phos 38 - 126 U/L 236(H)  AST 15 - 41 U/L 52(H)  ALT 0 - 44 U/L 61(H)   EdFlavia Shippercore: No flowsheet data  found.   After visit meds:  Allergies as of 04/30/2021   No Known Allergies      Medication List     STOP taking these medications    Accu-Chek Guide test strip Generic drug: glucose blood   Accu-Chek Softclix Lancets lancets   aspirin EC 81 MG tablet   Blood Pressure Kit Devi   labetalol 300 MG tablet Commonly known as: NORMODYNE   metFORMIN 500 MG tablet Commonly known as: Glucophage       TAKE these medications    acetaminophen 325 MG tablet Commonly known as: Tylenol Take 2 tablets (650 mg total) by mouth every 4 (four) hours as needed (for pain scale < 4).   furosemide 20 MG tablet Commonly known as: LASIX Take 1 tablet (20 mg total) by  mouth daily.   ibuprofen 600 MG tablet Commonly known as: ADVIL Take 1 tablet (600 mg total) by mouth every 6 (six) hours.   NIFEdipine 60 MG 24 hr tablet Commonly known as: ADALAT CC Take 1 tablet (60 mg total) by mouth daily.   prenatal multivitamin Tabs tablet Take 1 tablet by mouth daily at 12 noon.         Discharge home in stable condition Infant Feeding: Bottle Infant Disposition:home with mother but plan is likely for adoption with sister.  Discharge instruction: per After Visit Summary and Postpartum booklet. Activity: Advance as tolerated. Pelvic rest for 6 weeks.  Diet: carb modified diet Future Appointments: Future Appointments  Date Time Provider [place]  05/05/2021 10:00 AM [facility] NURSE [facility]  06/10/2021  8:15 AM Woodroe Mode, MD [facility]  06/10/2021  8:50 AM [facility] [place]   Follow up Visit:  [facility] Follow up in 1 week(s).   Specialty: Obstetrics and Gynecology Why: You have an appointment on 1/12 for blood pressure check and your postpartum appointment on 2/17. Contact information: [place] [address] [phone]                Message sent to [facility] by Dr Higinio Plan:  Please schedule this patient for a In person postpartum visit in 6 weeks with the following provider: Any provider. Additional Postpartum F/U:2 hour GTT and BP check 1 week  High risk pregnancy complicated by: GDM and HTN Delivery mode:  Vaginal, Spontaneous  Anticipated Birth Control:  Nexplanon   04/30/2021 Radene Gunning, MD

## 2021-04-28 NOTE — H&P (Signed)
OBSTETRIC ADMISSION HISTORY AND PHYSICAL  Cindy Stewart is a 36 y.o. female G21P3003 with IUP at 45w6dby 26 week [place] for SOL. Arrived to L&D at 8cm and delivered shortly after. She plans on formula feeding. She request nexplanon for birth control.  Her sister would like to adopt this infant, will need assistance postpartum.   [facility]  Dating: By [place]--->  Estimated Date of Delivery: 05/13/21  Sono:    '@[redacted]w[redacted]d' , CWD, normal anatomy, cephalic presentation, 28295A 13% EFW   Prenatal History/Complications:  --Chronic hypertension: on labetalol --A2GDM poorly controlled, recently started on metformin  --Food insecurity   Past Medical History: Past Medical History:  Diagnosis Date   Cervical cancer screening 06/01/2020   Hypertension    Hypotension    [place]us [place]     Past Surgical History: Past Surgical History:  Procedure Laterality Date   NO PAST SURGERIES      Obstetrical History: OB History     Gravida  3   Para  3   Term  3   Preterm      AB      Living  3      SAB      IAB      Ectopic      Multiple  0   Live Births  3           Social History Social History   Socioeconomic History   Marital status: Married    Spouse name: Not on file   Number of children: 2   Years of education: Not on file   Highest education level: Not on file  Occupational History   Not on file  Tobacco Use   Smoking status: Never   Smokeless tobacco: Never  Vaping Use   Vaping Use: Never used  Substance and Sexual Activity   Alcohol use: No   Drug use: No   Sexual activity: Yes    Birth control/protection: None  Other Topics Concern   Not on file  Social History Narrative   Not on file   Social Determinants of Health   Financial Resource Strain: Not on file  Food Insecurity: No Food Insecurity   Worried About Running Out of Food in the Last Year: Never true   Ran Out of Food in the Last Year: Never true   Transportation Needs: No Transportation Needs   Lack of Transportation (Medical): No   Lack of Transportation (Non-Medical): No  Physical Activity: Not on file  Stress: Not on file  Social Connections: Not on file    Family History: Family History  Problem Relation Age of Onset   Cancer Mother     Allergies: No Known Allergies  Medications Prior to Admission  Medication Sig Dispense Refill Last Dose   Accu-Chek Softclix Lancets lancets Use as instructed 100 each 12    aspirin EC 81 MG tablet Take 1 tablet (81 mg total) by mouth daily. Swallow whole. 30 tablet 5    Blood Pressure Monitoring (BLOOD PRESSURE KIT) DEVI 1 each by Does not apply route once a week. 1 each 0    glucose blood (ACCU-CHEK GUIDE) test strip Use as instructed 100 each 12    labetalol (NORMODYNE) 300 MG tablet Take 1 tablet (300 mg total) by mouth every 12 (twelve) hours. 60 tablet 2    metFORMIN (GLUCOPHAGE) 500 MG tablet Take 1 tablet (500 mg total) by mouth 2 (two) times daily  with a meal. 60 tablet 1    Prenatal Vit-Fe Fumarate-FA (PRENATAL MULTIVITAMIN) TABS tablet Take 1 tablet by mouth daily at 12 noon.        Review of Systems   All systems reviewed and negative except as stated in HPI  Blood pressure (!) 146/100, pulse 71, resp. rate 18, last menstrual period 08/28/2020, SpO2 100 %, unknown if currently breastfeeding. General appearance: alert, cooperative, and severe distress (in active labor)  Lungs: Normal WOB  Heart: regular rate and rhythm Abdomen: soft, non-tender Pelvic: NEFG Extremities: Homans sign is negative, no sign of DVT Presentation: cephalic Fetal monitoringBaseline: 125 bpm, Variability: Good {> 6 bpm), Accelerations: Reactive, and Decelerations: Absent Uterine activity every 2 minutes  Dilation: 10 Effacement (%): 700 Exam by:: Roderic Scarce, RNC   Prenatal labs: ABO, Rh: --/--/A POS (01/05 1749) Antibody: NEG (01/05 0944) Rubella: 15.10 (10/04 1109) RPR: NON  REACTIVE (01/05 0944)  HBsAg: Negative (10/04 1109)  HIV: Non Reactive (10/04 1109)  GBS: Negative/-- (12/21 1002)  2 hr Glucola failed  Genetic screening LR NIPS Anatomy US normal   Prenatal Transfer Tool  Maternal Diabetes: Yes:  Diabetes Type:  Insulin/Medication controlled Genetic Screening: Normal Maternal Ultrasounds/Referrals: Normal Fetal Ultrasounds or other Referrals:  None Maternal Substance Abuse:  No Significant Maternal Medications:  None Significant Maternal Lab Results: Group B Strep negative  Results for orders placed or performed during the hospital encounter of 04/28/21 (from the past 24 hour(s))  Resp Panel by RT-PCR (Flu A&B, Covid) Nasopharyngeal Swab   Collection Time: 04/28/21  9:14 AM   Specimen: Nasopharyngeal Swab; Nasopharyngeal(NP) swabs in vial transport medium  Result Value Ref Range   SARS Coronavirus 2 by RT PCR POSITIVE (A) NEGATIVE   Influenza A by PCR NEGATIVE NEGATIVE   Influenza B by PCR NEGATIVE NEGATIVE  CBC   Collection Time: 04/28/21  9:44 AM  Result Value Ref Range   WBC 17.5 (H) 4.0 - 10.5 K/uL   RBC 4.53 3.87 - 5.11 MIL/uL   Hemoglobin 12.1 12.0 - 15.0 g/dL   HCT 37.1 36.0 - 46.0 %   MCV 81.9 80.0 - 100.0 fL   MCH 26.7 26.0 - 34.0 pg   MCHC 32.6 30.0 - 36.0 g/dL   RDW 13.9 11.5 - 15.5 %   Platelets 255 150 - 400 K/uL   nRBC 0.0 0.0 - 0.2 %  RPR   Collection Time: 04/28/21  9:44 AM  Result Value Ref Range   RPR Ser Ql NON REACTIVE NON REACTIVE  Comprehensive metabolic panel   Collection Time: 04/28/21  9:44 AM  Result Value Ref Range   Sodium 133 (L) 135 - 145 mmol/L   Potassium 4.6 3.5 - 5.1 mmol/L   Chloride 107 98 - 111 mmol/L   CO2 15 (L) 22 - 32 mmol/L   Glucose, Bld 125 (H) 70 - 99 mg/dL   BUN <5 (L) 6 - 20 mg/dL   Creatinine, Ser 0.68 0.44 - 1.00 mg/dL   Calcium 8.6 (L) 8.9 - 10.3 mg/dL   Total Protein 7.2 6.5 - 8.1 g/dL   Albumin 2.9 (L) 3.5 - 5.0 g/dL   AST 90 (H) 15 - 41 U/L   ALT 93 (H) 0 - 44 U/L    Alkaline Phosphatase 335 (H) 38 - 126 U/L   Total Bilirubin 0.7 0.3 - 1.2 mg/dL   GFR, Estimated >60 >60 mL/min   Anion gap 11 5 - 15  Type and screen   Collection Time: 04/28/21  9:44 AM  Result Value Ref Range   ABO/RH(D) A POS    Antibody Screen NEG    Sample Expiration      05/01/2021,2359 Performed at [facility], [place] [address] [place], [place] [address]     Patient Active Problem List   Diagnosis Date Noted   GDM, class A2 04/28/2021   Gestational diabetes mellitus (GDM) 03/24/2021   Abnormal antenatal AFP screen 01/27/2021   Supervision of high risk pregnancy, antepartum 01/25/2021   Chronic hypertension affecting pregnancy 01/25/2021   Unplanned pregnancy, unknown if wanted 10/27/2020   Vitamin D deficiency 06/01/2020   GERD (gastroesophageal reflux disease) 06/01/2020   Tobacco use 06/01/2020    Assessment/Plan:  Chandel Shone is a 36 y.o. G3P3003 at 17w6dhere for active labor with precipitous delivery.   #Labor: See delivery note--delivered shortly after arrival with expectant management.  #Pain: None  #FWB: Cat 1 strip prior to SROM, difficulty monitoring afterwards #ID: GBS Negative #MOF: Formula  #MOC: Nexplanon inpatient  #Circ: NA (female)   #Chronic hypertension: Severe range pressures on arrival, however she had not taken her labetalol yet today and in active labor without pain medication. Treat with IV labetalol now, f/u pre-e labs and continued Bps.   #A2GDM: Will collect CBG postpartum. Poorly controlled, recently started metformin.   #Desires to place infant for adoption: Sister would like to adopt. SW consult placed.   #COVID positive: Returned after delivery. No cough or difficulty breathing noted during evaluation. Afebrile.   SPatriciaann Clan DO  04/28/2021, 12:33 PM

## 2021-04-28 NOTE — Progress Notes (Signed)
Presents via EMS for labor eval.  EMS reports pt having ctxs every 2 minutes.  States pt scheduled for IOL in 2 days for Pre Eclampsia and elevated BP noted during transport.    Pt assisted onto stretcher, EFM applied, FHR 120's without audible decels (not tracing in [place], registration not completed yet).  Pt speaks Nepali, interpreter Dev [phone] being utilized.

## 2021-04-29 ENCOUNTER — Ambulatory Visit: Payer: Medicaid Other

## 2021-04-29 LAB — COMPREHENSIVE METABOLIC PANEL
ALT: 61 U/L — ABNORMAL HIGH (ref 0–44)
AST: 52 U/L — ABNORMAL HIGH (ref 15–41)
Albumin: 2.3 g/dL — ABNORMAL LOW (ref 3.5–5.0)
Alkaline Phosphatase: 236 U/L — ABNORMAL HIGH (ref 38–126)
Anion gap: 10 (ref 5–15)
BUN: 6 mg/dL (ref 6–20)
CO2: 20 mmol/L — ABNORMAL LOW (ref 22–32)
Calcium: 7.8 mg/dL — ABNORMAL LOW (ref 8.9–10.3)
Chloride: 102 mmol/L (ref 98–111)
Creatinine, Ser: 0.66 mg/dL (ref 0.44–1.00)
GFR, Estimated: >60 mL/min (ref >60)
Glucose, Bld: 140 mg/dL — ABNORMAL HIGH (ref 70–99)
Potassium: 3.3 mmol/L — ABNORMAL LOW (ref 3.5–5.1)
Sodium: 132 mmol/L — ABNORMAL LOW (ref 135–145)
Total Bilirubin: 0.2 mg/dL — ABNORMAL LOW (ref 0.3–1.2)
Total Protein: 5.7 g/dL — ABNORMAL LOW (ref 6.5–8.1)

## 2021-04-29 LAB — CBC WITH DIFFERENTIAL/PLATELET
Abs Immature Granulocytes: 0.2 10*3/uL — ABNORMAL HIGH (ref 0.00–0.07)
Basophils Absolute: 0 10*3/uL (ref 0.0–0.1)
Basophils Relative: 0 %
Eosinophils Absolute: 0.6 10*3/uL — ABNORMAL HIGH (ref 0.0–0.5)
Eosinophils Relative: 3 %
HCT: 34.1 % — ABNORMAL LOW (ref 36.0–46.0)
Hemoglobin: 11.3 g/dL — ABNORMAL LOW (ref 12.0–15.0)
Lymphocytes Relative: 17 %
Lymphs Abs: 3.5 10*3/uL (ref 0.7–4.0)
MCH: 26.8 pg (ref 26.0–34.0)
MCHC: 33.1 g/dL (ref 30.0–36.0)
MCV: 80.8 fL (ref 80.0–100.0)
Monocytes Absolute: 0.4 10*3/uL (ref 0.1–1.0)
Monocytes Relative: 2 %
Neutro Abs: 15.9 10*3/uL — ABNORMAL HIGH (ref 1.7–7.7)
Neutrophils Relative %: 77 %
Platelets: 291 10*3/uL (ref 150–400)
Promyelocytes Relative: 1 %
RBC: 4.22 MIL/uL (ref 3.87–5.11)
RDW: 14 % (ref 11.5–15.5)
WBC: 20.7 10*3/uL — ABNORMAL HIGH (ref 4.0–10.5)
nRBC: 0 % (ref 0.0–0.2)
nRBC: 0 /100 WBC

## 2021-04-29 MED ORDER — NIFEDIPINE ER OSMOTIC RELEASE 60 MG PO TB24
60.0000 mg | ORAL_TABLET | Freq: Every day | ORAL | Status: DC
  Administered 2021-04-29 – 2021-04-30 (×2): 60 mg via ORAL
  Filled 2021-04-29 (×2): qty 1

## 2021-04-29 NOTE — Progress Notes (Signed)
Post Partum Day 1 Subjective: Cough no SOB. Unable to reach Nepali interpreter and her family member interpreted  Objective: Blood pressure (!) 139/94, pulse 76, temperature 97.9 F (36.6 C), temperature source Oral, resp. rate 18, last menstrual period 08/28/2020, SpO2 100 %, unknown if currently breastfeeding.  Physical Exam:  General: alert, cooperative, and no distress Lochia: appropriate Uterine Fundus: firm DVT Evaluation: No evidence of DVT seen on physical exam.  Recent Labs    04/28/21 0944 04/29/21 0439  HGB 12.1 11.3*  HCT 37.1 34.1*   CMP Latest Ref Rng & Units 04/29/2021 04/28/2021 03/14/2021  Glucose 70 - 99 mg/dL 657(Q) 469(G) 295(M)  BUN 6 - 20 mg/dL 6 <8(U) <1(L)  Creatinine 0.44 - 1.00 mg/dL 2.44 0.10 2.72(Z)  Sodium 135 - 145 mmol/L 132(L) 133(L) 135  Potassium 3.5 - 5.1 mmol/L 3.3(L) 4.6 3.2(L)  Chloride 98 - 111 mmol/L 102 107 105  CO2 22 - 32 mmol/L 20(L) 15(L) 21(L)  Calcium 8.9 - 10.3 mg/dL 7.8(L) 8.6(L) 8.6(L)  Total Protein 6.5 - 8.1 g/dL 3.6(U) 7.2 6.6  Total Bilirubin 0.3 - 1.2 mg/dL 4.4(I) 0.7 0.5  Alkaline Phos 38 - 126 U/L 236(H) 335(H) 118  AST 15 - 41 U/L 52(H) 90(H) 23  ALT 0 - 44 U/L 61(H) 93(H) 15   CBC    Component Value Date/Time   WBC 20.7 (H) 04/29/2021 0439   RBC 4.22 04/29/2021 0439   HGB 11.3 (L) 04/29/2021 0439   HGB 10.4 (L) 01/25/2021 1109   HCT 34.1 (L) 04/29/2021 0439   HCT 30.6 (L) 01/25/2021 1109   PLT 291 04/29/2021 0439   PLT 184 01/25/2021 1109   MCV 80.8 04/29/2021 0439   MCV 86 01/25/2021 1109   MCH 26.8 04/29/2021 0439   MCHC 33.1 04/29/2021 0439   RDW 14.0 04/29/2021 0439   RDW 11.8 01/25/2021 1109   LYMPHSABS 3.5 04/29/2021 0439   LYMPHSABS 2.1 01/25/2021 1109   MONOABS 0.4 04/29/2021 0439   EOSABS 0.6 (H) 04/29/2021 0439   EOSABS 0.3 01/25/2021 1109   BASOSABS 0.0 04/29/2021 0439   BASOSABS 0.0 01/25/2021 1109     Assessment/Plan: Magnesium d/c at 1415. Procardia and lasix ordered    LOS: 1 day    Scheryl Darter 04/29/2021, 7:33 AM

## 2021-04-29 NOTE — Clinical Social Work Maternal (Signed)
CLINICAL SOCIAL WORK MATERNAL/CHILD NOTE  Patient Details  Name: Cindy Stewart MRN: [id number] Date of Birth: 1985-07-29  Date:  04/29/2021  Clinical Social Worker Initiating Note:  Celso Sickle, [place] Date/Time: Initiated:  04/29/21/1251     Child's Name:  Alan Ripper   Biological Parents:  Mother, Father (Father: Manson Allan)   Need for Interpreter:  Other (Comment Required) (Nepali)   Reason for Referral:      Address:  [address] [place] [address]    Phone number:  [phone] (home)     Additional phone number:   Household Members/Support Persons (HM/SP):   Household Member/Support Person 1, Household Member/Support Person 2, Household Member/Support Person 3   HM/SP Name Relationship DOB or Age  HM/SP -1 Dambar Karki FOB    HM/SP -2 Cheri Fowler daughter 10/10/13  HM/SP -3 Suham Karki son 08/29/19  HM/SP -4        HM/SP -5        HM/SP -6        HM/SP -7        HM/SP -8          Natural Supports (not living in the home):  Immediate Family   Professional Supports: None   Employment: Unemployed   Type of Work:     Education:  Other (comment) (6th Grade)   Homebound arranged:    Surveyor, quantity Resources:  Medicaid   Other Resources:  Sales executive     Cultural/Religious Considerations Which May Impact Care:    Strengths:      Psychotropic Medications:         Pediatrician:       Pediatrician List:   Radiographer, therapeutic    [place]    [facility]      Pediatrician Fax Number:    Risk Factors/Current Problems:  None   Cognitive State:  Alert  , Able to Concentrate  , Goal Oriented  , Linear Thinking     Mood/Affect:  Calm  , Interested  , Comfortable     CSW Assessment: CSW contacted MOB via telephone to complete psychosocial assessment. CSW utilized [employer] Nepali interpreter Othelia Pulling ([phone]). MOB reported that now was a good time to complete assessment. CSW  introduced self and explained role. MOB sounded calm and remained engaged during assessment, answering all questions. MOB reported that she resides with FOB and two older children. MOB reported that she receives food assistance and does not have any issues getting food for her home. MOB reported that they had not shopped for infant. MOB asked if the hospital could provide a car seat. CSW explained hospital car seat program and $30 fee for car seat. MOB reported that they would be able to get their own car seat. CSW asked if a pack and play would be helpful, MOB reported yes. CSW agreed to provide pack and play. MOB reported that they would be able to get all other essential items for infant. CSW inquired about MOB's support system, MOB initially reported no supports but later reported FOB and sister as supports.   CSW inquired about MOB's mental health history. MOB denied any mental health history. MOB denied any history of postpartum depression. CSW inquired about how MOB was feeling emotionally since giving birth, MOB reported that she had no emotions. CSW asked MOB to elaborate, MOB shared that she is feeling okay and is not sad. MOB  did not verbalize any acute mental health signs/symptoms. CSW assessed for safety, MOB denied SI, HI, and domestic violence.   CSW provided education regarding the baby blues period vs. perinatal mood disorders, discussed treatment and gave resources for mental health follow up if concerns arise.  CSW recommends self-evaluation during the postpartum time period using the New Mom Checklist from Postpartum Progress and encouraged MOB to contact a medical professional if symptoms are noted at any time. CSW informed MOB that some sad feelings may also arise surrounding adoption plans.   CSW asked MOB about adoption plans. MOB reported that she planned to give her child to her sister due to having a young child at home and her sister not having any children during her marriage. MOB  reported that she was not forced to do an adoption and is voluntarily pursuing adoption. MOB reported that she is not connected with an adoption attorney or adoption agency to complete adoption. CSW made MOB aware that if MOB does not have an attorney or agency working on the adoption, CSW cannot assist with the process. MOB reported that infant can discharge home with her and adoption can be completed post discharge. CSW asked if MOB needed adoption resources, MOB reported yes. CSW agreed to provide a list of local adoption agencies that MOB can contact for assistance. CSW inquired about any additional needs/concerns, MOB reported none.   CSW provided review of Sudden Infant Death Syndrome (SIDS) precautions.    CSW delivered pack and play and requested resources to NT on MOB's unit. NT agreed to take items into room.   CSW identifies no further need for intervention and no barriers to discharge at this time.   CSW Plan/Description:  Sudden Infant Death Syndrome (SIDS) Education, Perinatal Mood and Anxiety Disorder (PMADs) Education, No Further Intervention Required/No Barriers to Discharge, Other Information/Referral to [employer], [place] 04/29/2021, 12:54 PM

## 2021-04-30 MED ORDER — NIFEDIPINE ER 60 MG PO TB24: 60.0000 mg | ORAL_TABLET | Freq: Every day | ORAL | 1 refills | Status: AC

## 2021-04-30 MED ORDER — LIDOCAINE HCL 1 % IJ SOLN
0.0000 mL | Freq: Once | INTRAMUSCULAR | Status: AC | PRN
  Administered 2021-04-30: 3 mL via INTRADERMAL
  Filled 2021-04-30 (×2): qty 20

## 2021-04-30 MED ORDER — FUROSEMIDE 20 MG PO TABS: 20.0000 mg | ORAL_TABLET | Freq: Every day | ORAL | 0 refills | Status: AC

## 2021-04-30 MED ORDER — ACETAMINOPHEN 325 MG PO TABS: 650.0000 mg | ORAL_TABLET | ORAL | 0 refills | Status: AC | PRN

## 2021-04-30 MED ORDER — ETONOGESTREL 68 MG ~~LOC~~ IMPL
68.0000 mg | DRUG_IMPLANT | Freq: Once | SUBCUTANEOUS | Status: AC
  Administered 2021-04-30: 68 mg via SUBCUTANEOUS
  Filled 2021-04-30: qty 1

## 2021-04-30 MED ORDER — IBUPROFEN 600 MG PO TABS: 600.0000 mg | ORAL_TABLET | Freq: Four times a day (QID) | ORAL | 0 refills | Status: AC

## 2021-04-30 NOTE — Op Note (Signed)
GYNECOLOGY PROCEDURE NOTE  Cindy Stewart is a 36 y.o. [id number] here Nexplanon insertion.    Nexplanon Insertion Procedure Patient identified, informed consent performed, consent signed.   Patient does understand that irregular bleeding is a very common side effect of this medication. She was advised to have backup contraception for one week after placement.  Appropriate time out taken.    Patient's left arm was prepped and draped in the usual sterile fashion. The ruler used to measure and mark insertion area.  Patient was prepped with alcohol swab and then injected with 3 ml of 1% lidocaine.  She was prepped with betadine, Nexplanon removed from packaging,  Device confirmed in needle, then inserted full length of needle and withdrawn per handbook instructions. Nexplanon was able to palpated in the patient's arm; patient palpated the insert herself. There was minimal blood loss.  Patient insertion site covered with guaze and a pressure bandage to reduce any bruising.    The patient tolerated the procedure well and was given post procedure instructions.    Interpreter used throughout our interview.

## 2021-04-30 NOTE — Progress Notes (Signed)
Discharge instructions given to patient via [place] telephone interpreter, Synetta Fail. RN discussed follow-up appointment, medications, when to call the MD, HTN in pregnancy, vaginal delivery recovery, postnatal depression, nexplanon care, and additional resources for patient. All questions answered and patient verbalized understanding. Pt VS stable and no pain. Patient is alert and oriented x4 and ambulatory. Patient staying to await discharge for infant.

## 2021-04-30 NOTE — Plan of Care (Signed)

## 2021-05-05 ENCOUNTER — Telehealth: Payer: Self-pay

## 2021-05-05 ENCOUNTER — Ambulatory Visit: Payer: Medicaid Other

## 2021-05-05 ENCOUNTER — Encounter: Payer: Medicaid Other | Admitting: Family Medicine

## 2021-05-06 NOTE — Telephone Encounter (Signed)
Called pt with [employer] interpreter ID [phone] to follow up on missed BP check appt yesterday, 05/05/21. Called preferred number [phone] which is labeled as patient's spouse. Call is answered x 2 but no response after call is answered.  Called [phone]; pt would like to reschedule to Monday, 05/09/21 at 1530.

## 2021-05-09 ENCOUNTER — Ambulatory Visit: Payer: Medicaid Other

## 2021-05-11 ENCOUNTER — Telehealth (HOSPITAL_COMMUNITY): Payer: Self-pay | Admitting: *Deleted

## 2021-05-11 NOTE — Telephone Encounter (Signed)
Interpreter left message for patient to return call.  Duffy Rhody, RN 05-11-2021 at 3:25pm

## 2021-06-10 ENCOUNTER — Ambulatory Visit: Payer: Medicaid Other | Admitting: Obstetrics & Gynecology

## 2021-06-10 ENCOUNTER — Other Ambulatory Visit: Payer: Self-pay

## 2021-06-10 ENCOUNTER — Other Ambulatory Visit: Payer: Medicaid Other

## 2021-06-10 DIAGNOSIS — O24419 Gestational diabetes mellitus in pregnancy, unspecified control: Secondary | ICD-10-CM

## 2022-11-15 IMAGING — US US MFM FETAL BPP W/O NON-STRESS
1 series · 12 of 28 positions shown · non-contrast
Comparison: none

[Series 1: us mfm fetal bpp w/o non-stress · 28 acquisitions, 12 frames shown]
[im 2/28]
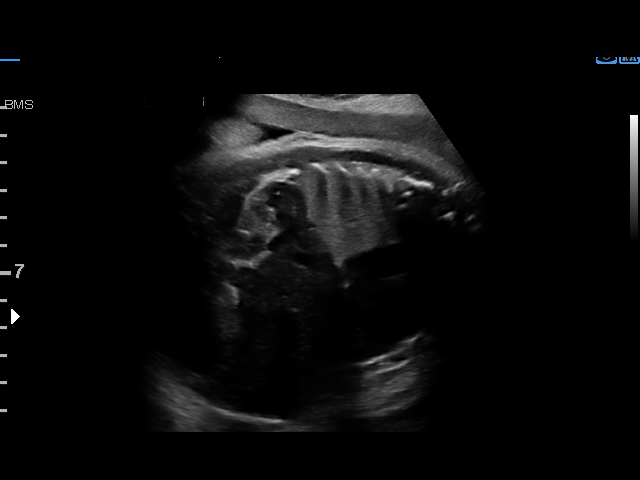
[im 4/28]
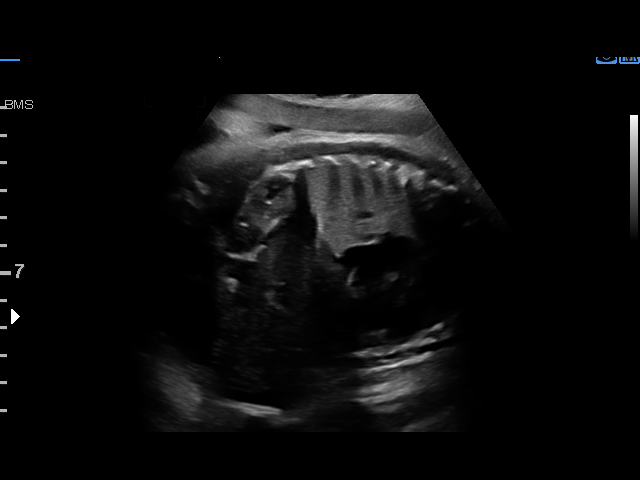
[im 6/28]
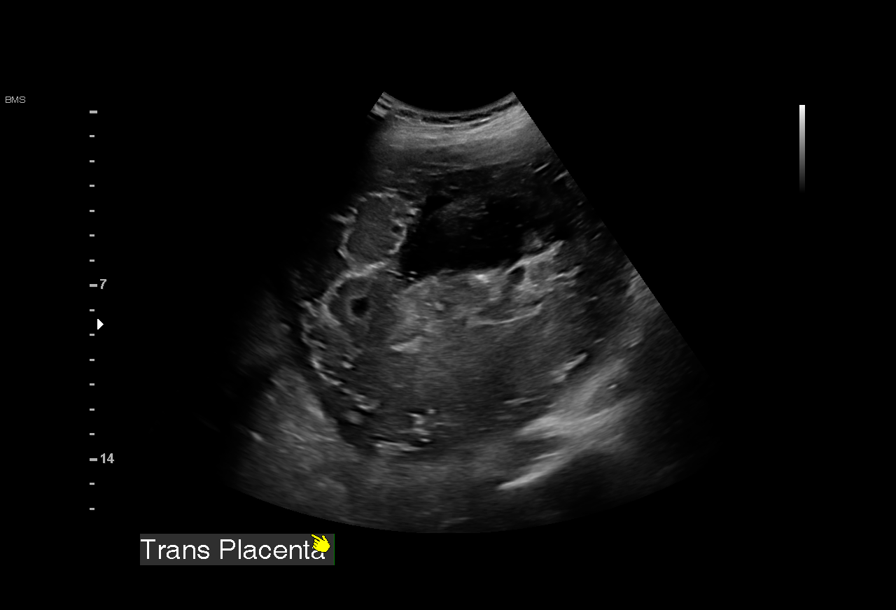
[im 9/28]
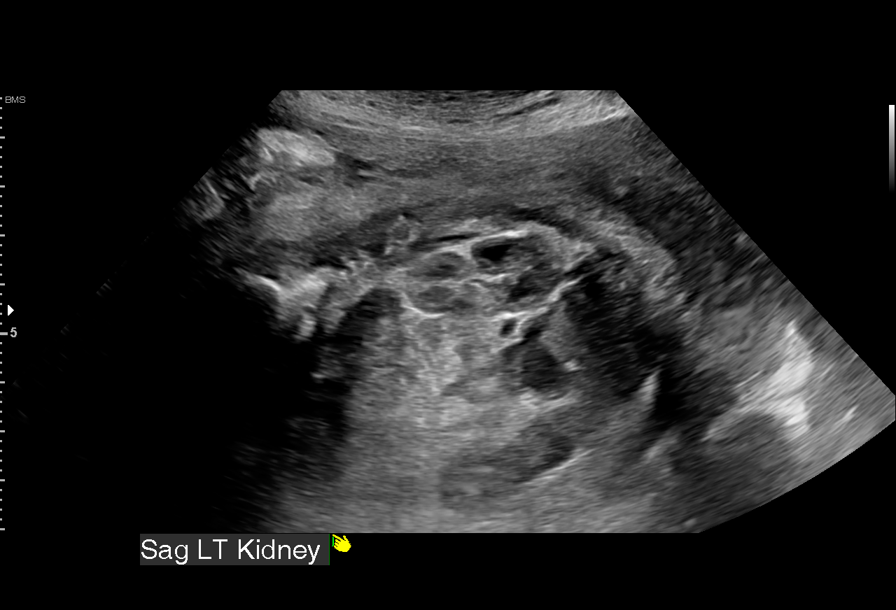
[im 11/28]
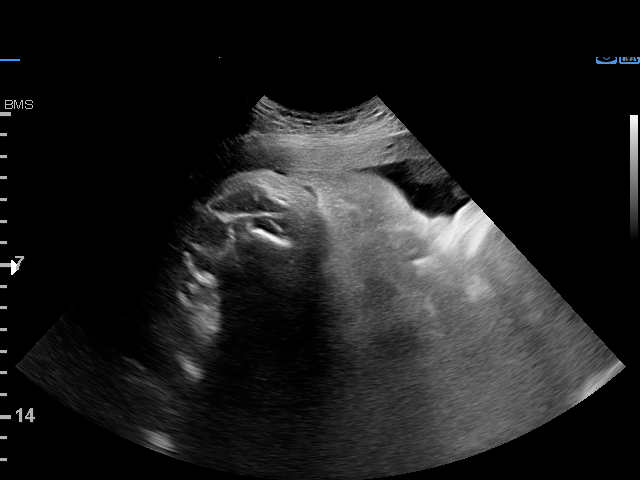
[im 13/28]
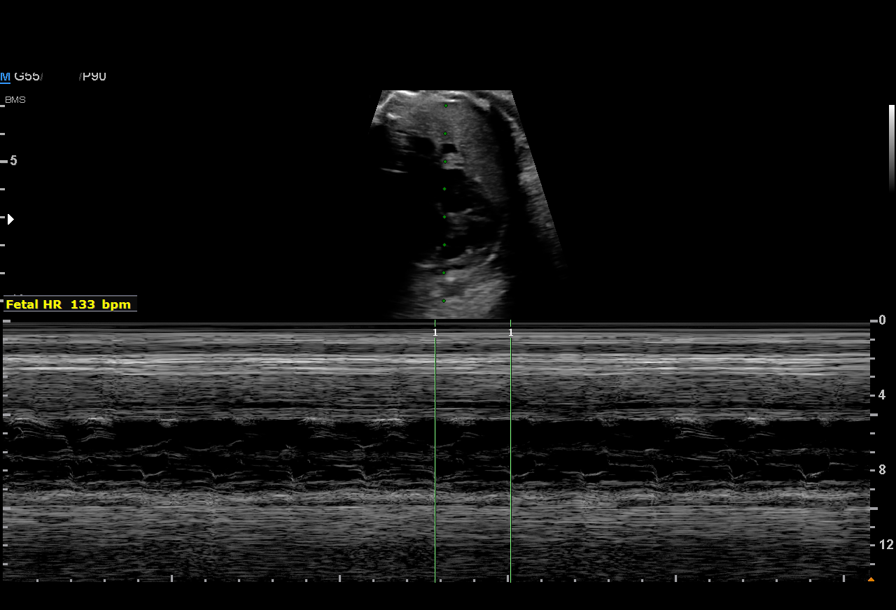
[im 16/28]
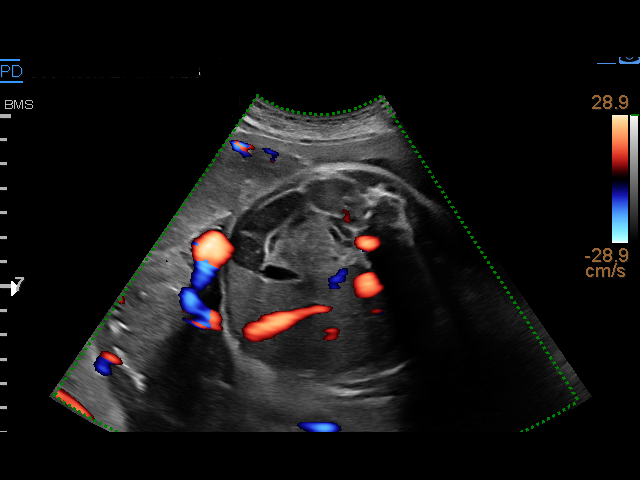
[im 18/28]
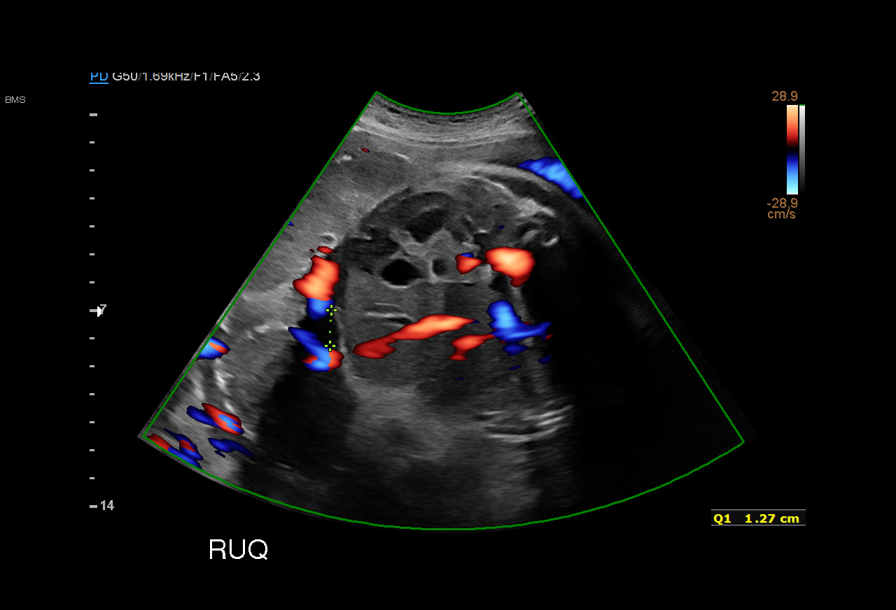
[im 20/28]
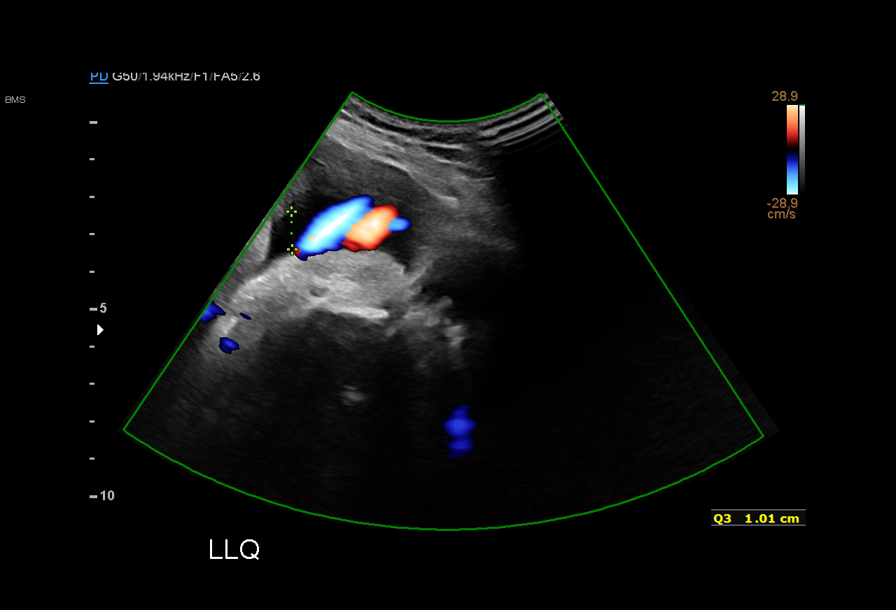
[im 23/28]
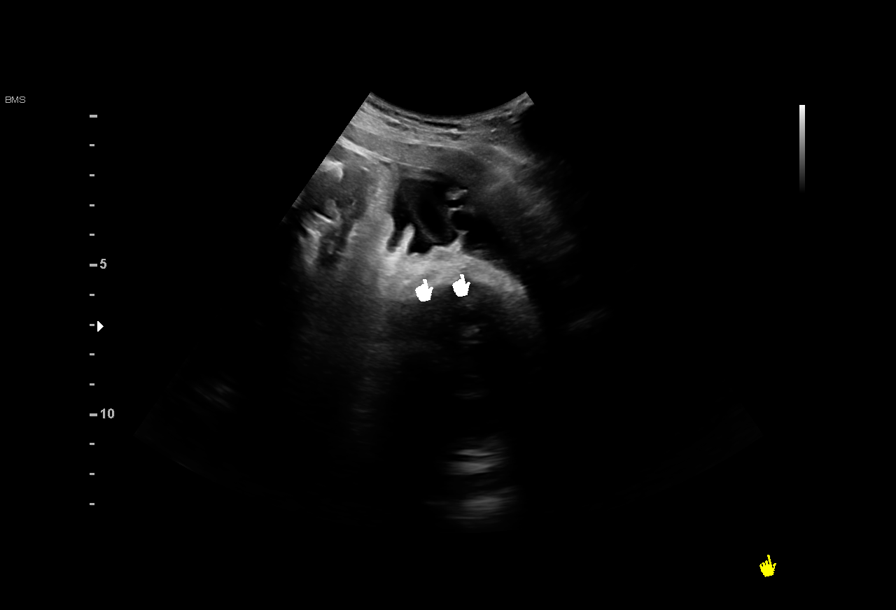
[im 25/28]
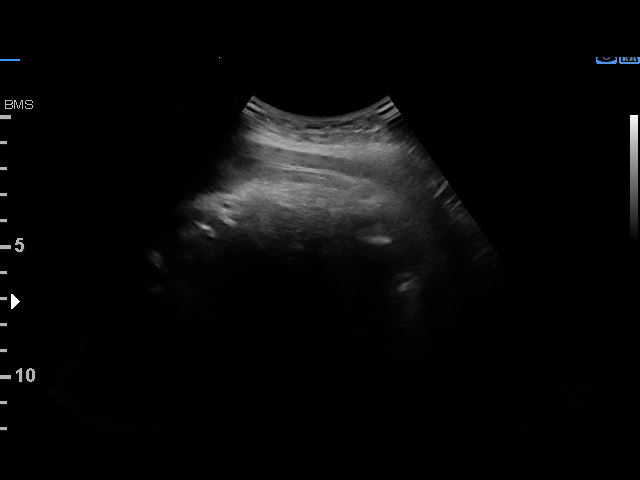
[im 27/28]
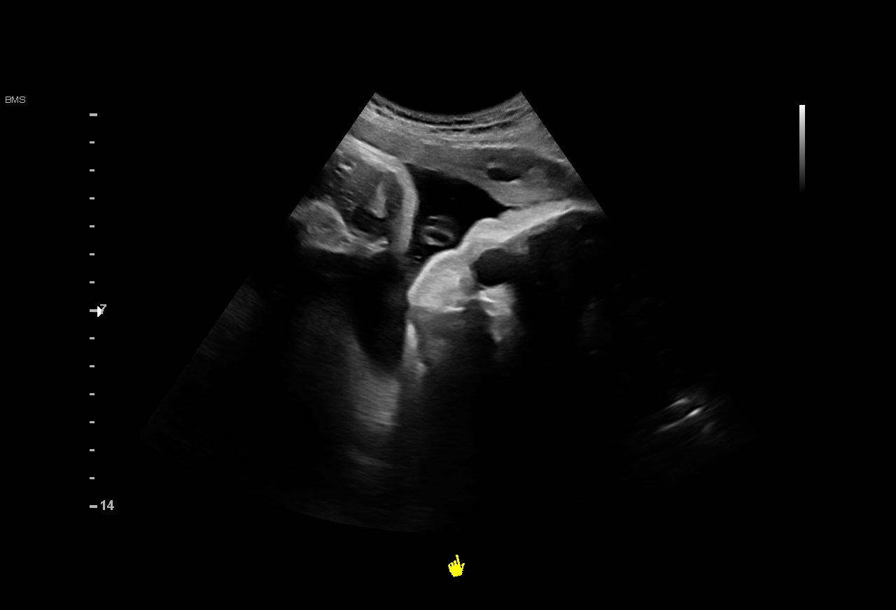

[12 of 28 positions shown; findings below may reference images not displayed]

BOYLAN NP

Indications

 Hypertension - Chronic/Pre-existing(labetalol)
 Advanced maternal age multigravida 35+,
 third trimester (35 yrs)
 Gestational diabetes in pregnancy, diet
 controlled
 Antenatal screening for elevated
 alphafetoprotein level (AFP) (2.88)
 LR NIPS
 Late to prenatal care, third trimester
 Tobacco use complicating pregnancy, third
 trimester
 35 weeks gestation of pregnancy
Fetal Evaluation

 Num Of Fetuses:         1
 Fetal Heart Rate(bpm):  133
 Cardiac Activity:       Observed
 Presentation:           Cephalic
 Placenta:               Posterior Fundal
 P. Cord Insertion:      Previously Visualized

 Amniotic Fluid
 AFI FV:      Within normal limits

 AFI Sum(cm)     %Tile       Largest Pocket(cm)
 9.09            15

 RUQ(cm)       RLQ(cm)       LUQ(cm)        LLQ(cm)

Biophysical Evaluation

 Amniotic F.V:   Pocket => 2 cm             F. Tone:        Observed
 F. Movement:    Observed                   Score:          [DATE]
 F. Breathing:   Observed
Biometry

 LV:        1.3  mm
OB History

 Gravidity:    3
 Living:       2
Gestational Age

 LMP:           32w 5d        Date:  08/28/20                 EDD:   06/04/21
 Best:          35w 6d     Det. By:  U/S  (02/09/21)          EDD:   05/13/21
Anatomy

 Ventricles:            Appears normal         Kidneys:                Appear normal
 Diaphragm:             Appears normal         Bladder:                Appears normal
 Stomach:               Appears normal, left
                        sided
Impression

 Antenatal testing performed given maternal [id number] and
 chronic hypertension on Labetalol
 The biophysical profile was [DATE] with good fetal movement and
 amniotic fluid volume.
Recommendations

 Continue weekly testing with growth in 1 week as previously
 scheduled.
